# Patient Record
Sex: Male | Born: 1985 | Race: Black or African American | Hispanic: No | Marital: Single | Smoking: Never smoker
Health system: Southern US, Community
[De-identification: ages and names within clinical notes are randomized; demographics above are authoritative.]

## PROBLEM LIST (undated history)

## (undated) DIAGNOSIS — J45909 Unspecified asthma, uncomplicated: Secondary | ICD-10-CM

## (undated) HISTORY — PX: HAND SURGERY: SHX662

---

## 2013-07-03 ENCOUNTER — Emergency Department (HOSPITAL_COMMUNITY)
Admission: EM | Admit: 2013-07-03 | Discharge: 2013-07-03 | Disposition: A | Discharge status: Home / Self Care | Payer: Self-pay | Attending: Emergency Medicine | Admitting: Emergency Medicine

## 2013-07-03 ENCOUNTER — Encounter (HOSPITAL_COMMUNITY): Payer: Self-pay | Admitting: Emergency Medicine

## 2013-07-03 ENCOUNTER — Emergency Department (HOSPITAL_COMMUNITY): Payer: Self-pay

## 2013-07-03 DIAGNOSIS — Y9389 Activity, other specified: Secondary | ICD-10-CM | POA: Insufficient documentation

## 2013-07-03 DIAGNOSIS — Y929 Unspecified place or not applicable: Secondary | ICD-10-CM | POA: Insufficient documentation

## 2013-07-03 DIAGNOSIS — S81809A Unspecified open wound, unspecified lower leg, initial encounter: Principal | ICD-10-CM

## 2013-07-03 DIAGNOSIS — IMO0002 Reserved for concepts with insufficient information to code with codable children: Secondary | ICD-10-CM | POA: Insufficient documentation

## 2013-07-03 DIAGNOSIS — Z79899 Other long term (current) drug therapy: Secondary | ICD-10-CM | POA: Insufficient documentation

## 2013-07-03 DIAGNOSIS — T148XXA Other injury of unspecified body region, initial encounter: Secondary | ICD-10-CM

## 2013-07-03 DIAGNOSIS — S91009A Unspecified open wound, unspecified ankle, initial encounter: Principal | ICD-10-CM

## 2013-07-03 DIAGNOSIS — S8010XA Contusion of unspecified lower leg, initial encounter: Secondary | ICD-10-CM | POA: Insufficient documentation

## 2013-07-03 DIAGNOSIS — S81009A Unspecified open wound, unspecified knee, initial encounter: Secondary | ICD-10-CM | POA: Insufficient documentation

## 2013-07-03 HISTORY — DX: Unspecified asthma, uncomplicated: J45.909

## 2013-07-03 MED ORDER — BACITRACIN ZINC 500 UNIT/GM EX OINT: 1.0000 "application " | TOPICAL_OINTMENT | Freq: Two times a day (BID) | CUTANEOUS | Status: DC

## 2013-07-03 NOTE — ED Notes (Signed)
Pt unable to be aroused at this time, unable to obtain contact info for d/c. RR even and unlabored.

## 2013-07-03 NOTE — ED Notes (Signed)
Bed: [facility]08 Expected date:  Expected time:  Means of arrival:  Comments: 

## 2013-07-03 NOTE — ED Notes (Signed)
pts brother now at bedside, pt awake, wound cleaned and bandaged, pt given scrubs to wear home
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
## 2013-07-23 ENCOUNTER — Encounter (HOSPITAL_COMMUNITY): Payer: Self-pay | Admitting: Emergency Medicine

## 2013-07-23 ENCOUNTER — Emergency Department (HOSPITAL_COMMUNITY)
Admission: EM | Admit: 2013-07-23 | Discharge: 2013-07-23 | Disposition: A | Discharge status: Home / Self Care | Payer: Self-pay | Attending: Emergency Medicine | Admitting: Emergency Medicine

## 2013-07-23 DIAGNOSIS — Z792 Long term (current) use of antibiotics: Secondary | ICD-10-CM | POA: Insufficient documentation

## 2013-07-23 DIAGNOSIS — Z5189 Encounter for other specified aftercare: Secondary | ICD-10-CM

## 2013-07-23 DIAGNOSIS — Z4802 Encounter for removal of sutures: Secondary | ICD-10-CM | POA: Insufficient documentation

## 2013-07-23 DIAGNOSIS — J45909 Unspecified asthma, uncomplicated: Secondary | ICD-10-CM | POA: Insufficient documentation

## 2013-07-23 NOTE — Progress Notes (Signed)
[facility] provided pt with a list of primary care resources to help patient establish a pcp.  °

## 2013-07-23 NOTE — ED Provider Notes (Signed)
CSN: [id number]     Arrival date & time 07/23/13  0845 History   First MD Initiated Contact with Patient 07/23/13 [phone]     Chief Complaint  Patient presents with  . Suture / Staple Removal     (Consider location/radiation/quality/duration/timing/severity/associated sxs/prior Treatment) Patient is a 28 y.o. male presenting with suture removal and wound check. The history is provided by the patient.  Suture / Staple Removal This is a new problem. The current episode started more than 1 week ago. Episode frequency: once. The problem has been resolved. Pertinent negatives include no chest pain, no abdominal pain, no headaches and no shortness of breath. Nothing aggravates the symptoms. Nothing relieves the symptoms. He has tried nothing for the symptoms. The treatment provided significant relief.  Wound Check This is a new problem. The current episode started more than 1 week ago. The problem occurs constantly. The problem has been gradually improving. Pertinent negatives include no chest pain, no abdominal pain, no headaches and no shortness of breath. Nothing aggravates the symptoms. Nothing relieves the symptoms. He has tried nothing for the symptoms. The treatment provided significant relief.    Past Medical History  Diagnosis Date  . Asthma    Past Surgical History  Procedure Laterality Date  . Hand surgery Right    History reviewed. No pertinent family history. History  Substance Use Topics  . Smoking status: Never Smoker   . Smokeless tobacco: Not on file  . Alcohol Use: Yes    Review of Systems  Constitutional: Negative for fever.  HENT: Negative for drooling and rhinorrhea.   Eyes: Negative for pain.  Respiratory: Negative for cough and shortness of breath.   Cardiovascular: Negative for chest pain and leg swelling.  Gastrointestinal: Negative for nausea, vomiting, abdominal pain and diarrhea.  Genitourinary: Negative for dysuria and hematuria.  Musculoskeletal: Negative  for gait problem and neck pain.  Skin: Negative for color change.  Neurological: Negative for numbness and headaches.  Hematological: Negative for adenopathy.  Psychiatric/Behavioral: Negative for behavioral problems.  All other systems reviewed and are negative.     Allergies  Peanut-containing drug products and Eggs or egg-derived products  Home Medications   Prior to Admission medications   Medication Sig Start Date End Date Taking? Authorizing Provider  albuterol (PROVENTIL HFA;VENTOLIN HFA) 108 (90 BASE) MCG/ACT inhaler Inhale 1 puff into the lungs every 6 (six) hours as needed for wheezing or shortness of breath.    Historical Provider, MD  bacitracin ointment Apply 1 application topically 2 (two) times daily. 07/03/13   Brandt LoosenJulie Manly, MD   BP 139/76  Pulse 98  Temp(Src) 97.8 F (36.6 C) (Oral)  Resp 16  Ht 5\' 8"  (1.727 m)  Wt 168 lb (76.204 kg)  BMI 25.55 kg/m2  SpO2 100% Physical Exam  Nursing note and vitals reviewed. Constitutional: He is oriented to person, place, and time. He appears well-developed and well-nourished.  HENT:  Head: Normocephalic and atraumatic.  Right Ear: External ear normal.  Left Ear: External ear normal.  Nose: Nose normal.  Mouth/Throat: Oropharynx is clear and moist. No oropharyngeal exudate.  Eyes: Conjunctivae and EOM are normal. Pupils are equal, round, and reactive to light.  Neck: Normal range of motion. Neck supple.  Cardiovascular: Normal rate, regular rhythm, normal heart sounds and intact distal pulses.  Exam reveals no gallop and no friction rub.   No murmur heard. Pulmonary/Chest: Effort normal and breath sounds normal. No respiratory distress. He has no wheezes.  Abdominal: Soft. Bowel  sounds are normal. He exhibits no distension. There is no tenderness. There is no rebound and no guarding.  Musculoskeletal: Normal range of motion. He exhibits no edema and no tenderness.  Healing laceration to the right proximal anterior shin.  There is some mild superficial purulent drainage around one of the staples. There is no induration or erythema. There is no fluctuance. The patient has normal range of motion of the right knee without pain.  Neurological: He is alert and oriented to person, place, and time.  Skin: Skin is warm and dry.  Psychiatric: He has a normal mood and affect. His behavior is normal.    ED Course  Procedures (including critical care time) Labs Review Labs Reviewed - No data to display  Imaging Review No results found.   EKG Interpretation None     SUTURE REMOVAL Performed by: Purvis Sheffield, S  Consent: Verbal consent obtained. Consent given by: patient Required items: required blood products, implants, devices, and special equipment available Time out: Immediately prior to procedure a "time out" was called to verify the correct patient, procedure, equipment, support staff and site/side marked as required.  Location: right anterior shin  Wound Appearance: clean, as documented in PE  Sutures/Staples Removed: 6  Patient tolerance: Patient tolerated the procedure well with no immediate complications.     MDM   Final diagnoses:  Visit for suture removal  Visit for wound check    9:07 AM 28 y.o. male w presents for staple removal after an injury previously documented here on 6/27. He denies any fevers. He states that he did see some very scant purulent drainage around one of the staples last night. He is afebrile and vital signs are unremarkable here. I removed all the staples. There was some very scant superficial purulent drainage around one of the staples. The wound is not erythematous or indurated. There is no evidence of underlying infection. The wound otherwise appears well. There is no pain with range of motion of the knee. The patient would prefer not to be on antibiotics and I think this is reasonable. Will have him observe the wound for any increased redness, swelling, pain,  or purulence.  9:09 AM:  I have discussed the diagnosis/risks/treatment options with the patient and believe the pt to be eligible for discharge home to follow-up here for any worsening. We also discussed returning to the ED immediately if new or worsening sx occur. We discussed the sx which are most concerning (e.g., redness, swelling, inc pain, purulent drainage) that necessitate immediate return. Medications administered to the patient during their visit and any new prescriptions provided to the patient are listed below.  Medications given during this visit Medications - No data to display  New Prescriptions   No medications on file       Junius Argyle, MD 07/23/13 [phone]
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
## 2013-12-12 ENCOUNTER — Emergency Department (HOSPITAL_COMMUNITY): Payer: Self-pay

## 2013-12-12 ENCOUNTER — Encounter (HOSPITAL_COMMUNITY): Payer: Self-pay | Admitting: Emergency Medicine

## 2013-12-12 ENCOUNTER — Emergency Department (HOSPITAL_COMMUNITY)
Admission: EM | Admit: 2013-12-12 | Discharge: 2013-12-12 | Disposition: A | Discharge status: Home / Self Care | Payer: Self-pay | Attending: Emergency Medicine | Admitting: Emergency Medicine

## 2013-12-12 DIAGNOSIS — R0789 Other chest pain: Secondary | ICD-10-CM | POA: Insufficient documentation

## 2013-12-12 DIAGNOSIS — J45909 Unspecified asthma, uncomplicated: Secondary | ICD-10-CM | POA: Insufficient documentation

## 2013-12-12 DIAGNOSIS — R079 Chest pain, unspecified: Secondary | ICD-10-CM

## 2013-12-12 NOTE — Discharge Instructions (Signed)
°  You may take Ibuprofen 800 mg every 8 hours as needed for pain.  Take with food.  Chest Wall Pain Chest wall pain is pain in or around the bones and muscles of your chest. It may take up to 6 weeks to get better. It may take longer if you must stay physically active in your work and activities.  CAUSES  Chest wall pain may happen on its own. However, it may be caused by:  A viral illness like the flu.  Injury.  Coughing.  Exercise.  Arthritis.  Fibromyalgia.  Shingles. HOME CARE INSTRUCTIONS   Avoid overtiring physical activity. Try not to strain or perform activities that cause pain. This includes any activities using your chest or your abdominal and side muscles, especially if heavy weights are used.  Put ice on the sore area.  Put ice in a plastic bag.  Place a towel between your skin and the bag.  Leave the ice on for 15-20 minutes per hour while awake for the first 2 days.  Only take over-the-counter or prescription medicines for pain, discomfort, or fever as directed by your caregiver. SEEK IMMEDIATE MEDICAL CARE IF:   Your pain increases, or you are very uncomfortable.  You have a fever.  Your chest pain becomes worse.  You have new, unexplained symptoms.  You have nausea or vomiting.  You feel sweaty or lightheaded.  You have a cough with phlegm (sputum), or you cough up blood. MAKE SURE YOU:   Understand these instructions.  Will watch your condition.  Will get help right away if you are not doing well or get worse. Document Released: 12/24/2004 Document Revised: 03/18/2011 Document Reviewed: 08/20/2010 [facility] Patient Information 2015 [employer] [place]. This information is not intended to replace advice given to you by your health care provider. Make sure you discuss any questions you have with your health care provider.
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
## 2013-12-12 NOTE — ED Provider Notes (Signed)
TIME SEEN: 9:40 AM  CHIEF COMPLAINT: Right-sided chest pain  HPI: Patient is a 28 y.o. M with history of asthma who presents emergency department with a week of intermittent right-sided chest pain that feels like a sharp pain that is worse with coughing, movement, palpation. He denies that he has had any shortness of breath, wheezing. No trauma to the chest. No nausea, vomiting, diaphoresis or dizziness. No lower extremity swelling or pain. No history of hypertension, diabetes, hyperlipidemia, tobacco use, family history of premature CAD. No history of recent prolonged immobilization such as hospitalization or long flight, fracture, surgery, trauma. No prior history of PE or DVT.  He states he did drink alcohol last night. Mother reports that he will binge drink on the weekends. Patient denies that he feels this is a problem and does not want detox.  ROS: See HPI Constitutional: no fever  Eyes: no drainage  ENT: no runny nose   Cardiovascular:   chest pain  Resp: no SOB  GI: no vomiting GU: no dysuria Integumentary: no rash  Allergy: no hives  Musculoskeletal: no leg swelling  Neurological: no slurred speech ROS otherwise negative  PAST MEDICAL HISTORY/PAST SURGICAL HISTORY:  Past Medical History  Diagnosis Date  . Asthma     MEDICATIONS:  Prior to Admission medications   Medication Sig Start Date End Date Taking? Authorizing Provider  albuterol (PROVENTIL HFA;VENTOLIN HFA) 108 (90 BASE) MCG/ACT inhaler Inhale 1 puff into the lungs every 6 (six) hours as needed for wheezing or shortness of breath.    Historical Provider, MD  multivitamin (ONE-A-DAY MEN'S) TABS tablet Take 1 tablet by mouth daily.    Historical Provider, MD  naproxen sodium (ANAPROX) 220 MG tablet Take 220 mg by mouth daily as needed (pain).    Historical Provider, MD    ALLERGIES:  Allergies  Allergen Reactions  . Peanut-Containing Drug Products Shortness Of Breath  . Eggs Or Egg-Derived Products     SOCIAL  HISTORY:  History  Substance Use Topics  . Smoking status: Never Smoker   . Smokeless tobacco: Not on file  . Alcohol Use: Yes    FAMILY HISTORY: No family history on file.  EXAM: BP 133/72 mmHg  Pulse 92  Temp(Src) 97.4 F (36.3 C) (Oral)  Resp 20  SpO2 100% CONSTITUTIONAL: Alert and oriented and responds appropriately to questions. Well-appearing; well-nourished, smells of alcohol HEAD: Normocephalic EYES: Conjunctivae clear, PERRL ENT: normal nose; no rhinorrhea; moist mucous membranes; pharynx without lesions noted NECK: Supple, no meningismus, no LAD  CARD: RRR; S1 and S2 appreciated; no murmurs, no clicks, no rubs, no gallops RESP: Normal chest excursion without splinting or tachypnea; breath sounds clear and equal bilaterally; no wheezes, no rhonchi, no rales, no hypoxia or respiratory distress, good aeration diffusely, as being full sentences CHEST:  Tender to palpation over the right chest wall without crepitus or ecchymosis or deformity, no lesions noted over the chest ABD/GI: Normal bowel sounds; non-distended; soft, non-tender, no rebound, no guarding BACK:  The back appears normal and is non-tender to palpation, there is no CVA tenderness EXT: Normal ROM in all joints; non-tender to palpation; no edema; normal capillary refill; no cyanosis    SKIN: Normal color for age and race; warm NEURO: Moves all extremities equally PSYCH: The patient's mood and manner are appropriate. Grooming and personal hygiene are appropriate.  MEDICAL DECISION MAKING: Pt here with right-sided chest wall pain. He is PERC negative and has a HEART score of 0. EKG shows no ischemic  changes. Chest x-ray clear showing no infiltrate, edema or pneumothorax. I feel he is safe to be discharged home. Have advised him to use ibuprofen. Discussed return precautions. Mother at bedside.  They agree with plan.     EKG Interpretation  Date/Time:  Sunday December 12 2013 09:29:02 EST Ventricular Rate:   90 PR Interval:  164 QRS Duration: 100 QT Interval:  350 QTC Calculation: 428 R Axis:   67 Text Interpretation:  Normal sinus rhythm Septal infarct , age undetermined Abnormal ECG No old tracing to compare Confirmed by Alexi Geibel,  DO, Dorlis Judice [phone]([id number]) on 12/12/2013 9:52:14 AM        Cristian MawKristen N Daryl Beehler, DO 12/12/13 1107

## 2016-04-07 IMAGING — DX DG CHEST 2V
2 series · 2 of 2 positions shown · non-contrast
Comparison: No priors.

CLINICAL DATA: 28-year-old male with 1 week history of right-sided
chest pain.

EXAM:
CHEST  2 VIEW

[chest pa]
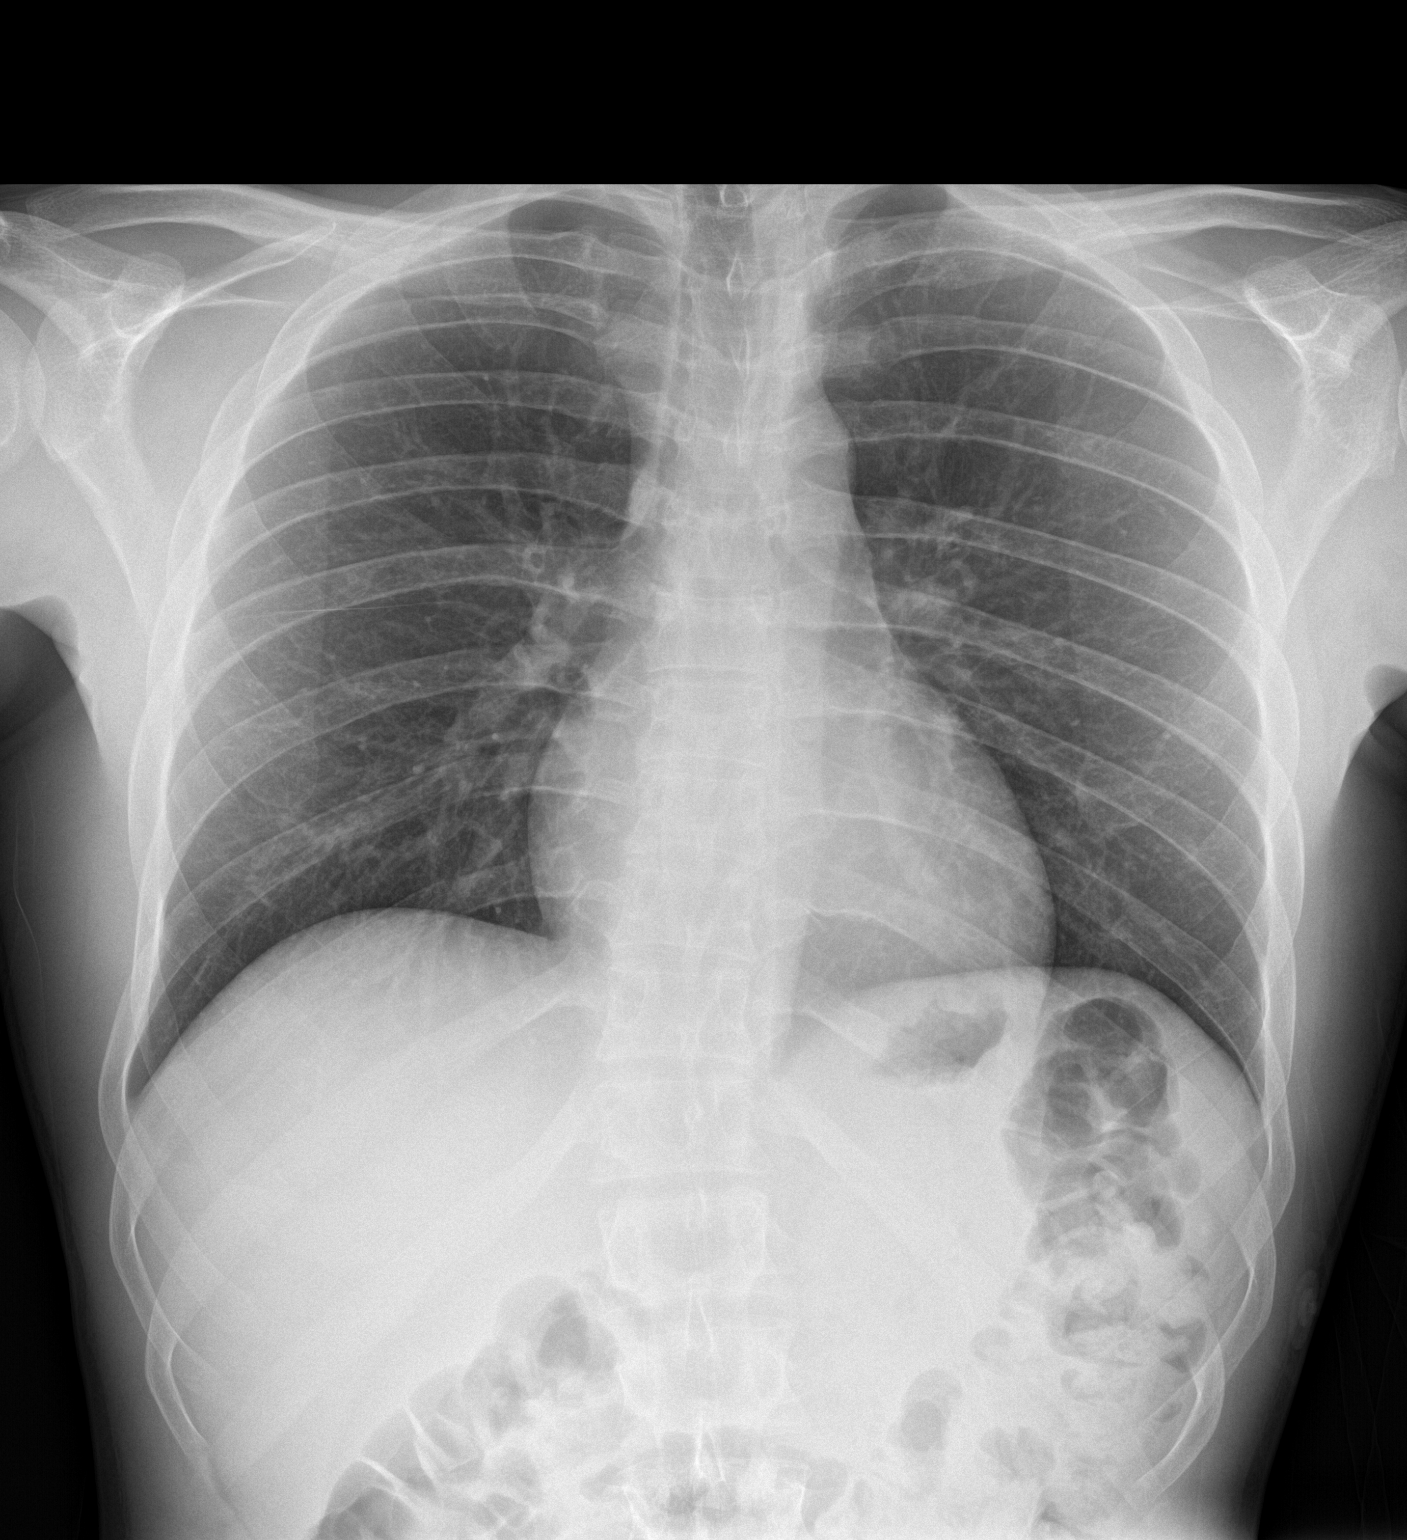

[chest lat]
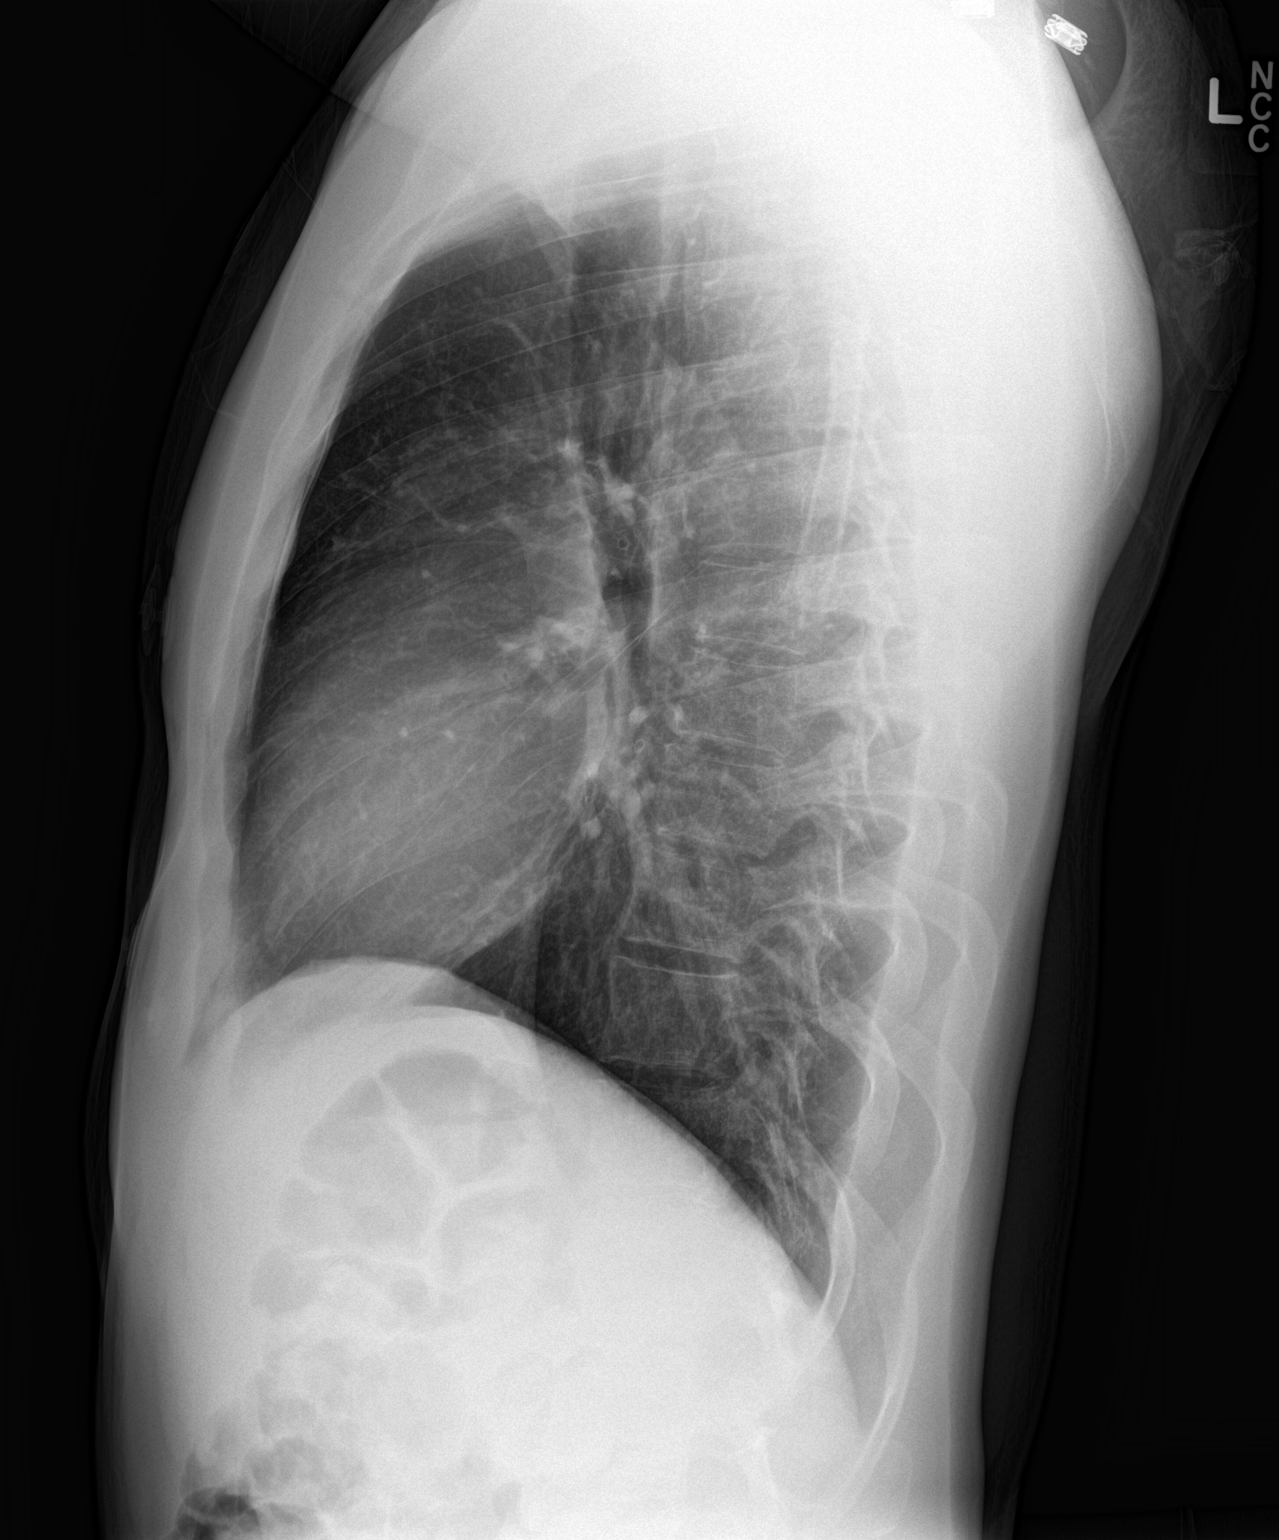

[2 of 2 positions shown; findings below may reference images not displayed]

FINDINGS: Lung volumes are normal. No consolidative airspace disease. No
pleural effusions. No pneumothorax. No pulmonary nodule or mass
noted. Pulmonary vasculature and the cardiomediastinal silhouette
are within normal limits.
IMPRESSION: No radiographic evidence of acute cardiopulmonary disease.

## 2022-12-06 ENCOUNTER — Other Ambulatory Visit (HOSPITAL_BASED_OUTPATIENT_CLINIC_OR_DEPARTMENT_OTHER): Payer: Self-pay

## 2022-12-06 ENCOUNTER — Emergency Department (HOSPITAL_BASED_OUTPATIENT_CLINIC_OR_DEPARTMENT_OTHER)
Admission: EM | Admit: 2022-12-06 | Discharge: 2022-12-06 | Disposition: A | Discharge status: Home / Self Care | Payer: 59 | Attending: Emergency Medicine | Admitting: Emergency Medicine

## 2022-12-06 ENCOUNTER — Other Ambulatory Visit: Payer: Self-pay

## 2022-12-06 ENCOUNTER — Encounter (HOSPITAL_BASED_OUTPATIENT_CLINIC_OR_DEPARTMENT_OTHER): Payer: Self-pay | Admitting: Emergency Medicine

## 2022-12-06 DIAGNOSIS — Z760 Encounter for issue of repeat prescription: Secondary | ICD-10-CM | POA: Diagnosis present

## 2022-12-06 DIAGNOSIS — Z7951 Long term (current) use of inhaled steroids: Secondary | ICD-10-CM | POA: Insufficient documentation

## 2022-12-06 DIAGNOSIS — Z91010 Allergy to peanuts: Secondary | ICD-10-CM | POA: Diagnosis not present

## 2022-12-06 DIAGNOSIS — J45909 Unspecified asthma, uncomplicated: Secondary | ICD-10-CM | POA: Insufficient documentation

## 2022-12-06 DIAGNOSIS — I10 Essential (primary) hypertension: Secondary | ICD-10-CM | POA: Insufficient documentation

## 2022-12-06 MED ORDER — ALBUTEROL SULFATE HFA 108 (90 BASE) MCG/ACT IN AERS
1.0000 | INHALATION_SPRAY | Freq: Four times a day (QID) | RESPIRATORY_TRACT | 2 refills | Status: DC | PRN
  Filled 2022-12-06: qty 6.7, 25d supply, fill #0

## 2022-12-06 MED ORDER — ALBUTEROL SULFATE HFA 108 (90 BASE) MCG/ACT IN AERS
1.0000 | INHALATION_SPRAY | Freq: Once | RESPIRATORY_TRACT | Status: AC
  Administered 2022-12-06: 1 via RESPIRATORY_TRACT
  Filled 2022-12-06: qty 6.7

## 2022-12-06 NOTE — ED Triage Notes (Signed)
Needs a refill on inhaler

## 2022-12-06 NOTE — Discharge Instructions (Signed)
As discussed, albuterol inhalers have been sent to your pharmacy.  Please do not hesitate to return if the worrisome signs and symptoms we discussed become apparent.

## 2022-12-06 NOTE — ED Provider Notes (Signed)
[place] EMERGENCY DEPARTMENT AT [facility] Provider Note   CSN: [id number] Arrival date & time: 12/06/22  1135     History  Chief Complaint  Patient presents with   Medication Refill    Cristian Crawford is a 37 y.o. male.   Medication Refill   37 year old male presents emergency department with request for medication refill.  Patient with history of asthma and is on albuterol inhaler.  States that he just realized the past few days that he was out of his albuterol inhaler.  This feels slightly wheezy but states "it is not really that bad."  Does not have a primary care so he came to the emergency department.  Denies any fever, symptoms of shortness of breath, chest pain, cough, abdominal pain, nausea, vomiting.  Past medical history significant for asthma  Home Medications Prior to Admission medications   Medication Sig Start Date End Date Taking? Authorizing Provider  albuterol (VENTOLIN HFA) 108 (90 Base) MCG/ACT inhaler Inhale 1-2 puffs into the lungs every 6 (six) hours as needed for wheezing or shortness of breath. 12/06/22  Yes Sherian Maroon A, PA  multivitamin (ONE-A-DAY MEN'S) TABS tablet Take 1 tablet by mouth daily.    [provider]  naproxen sodium (ANAPROX) 220 MG tablet Take 220 mg by mouth daily as needed (pain).    [provider]      Allergies    Peanut-containing drug products and Egg-derived products    Review of Systems   Review of Systems  All other systems reviewed and are negative.   Physical Exam Updated Vital Signs BP (!) 145/92   Pulse 78   Temp 98.3 F (36.8 C) (Oral)   Resp 18   Ht 5\' 8"  (1.727 m)   SpO2 100%   BMI 25.54 kg/m  Physical Exam Vitals and nursing note reviewed.  Constitutional:      General: He is not in acute distress.    Appearance: He is well-developed.  HENT:     Head: Normocephalic and atraumatic.  Eyes:     Conjunctiva/sclera: Conjunctivae normal.  Cardiovascular:     Rate and  Rhythm: Normal rate and regular rhythm.     Heart sounds: No murmur heard. Pulmonary:     Effort: Pulmonary effort is normal. No respiratory distress.     Breath sounds: Wheezing present.     Comments: Faint wheeze auscultated bilateral lung fields. Abdominal:     Palpations: Abdomen is soft.     Tenderness: There is no abdominal tenderness.  Musculoskeletal:        General: No swelling.     Cervical back: Neck supple.     Right lower leg: No edema.     Left lower leg: No edema.  Skin:    General: Skin is warm and dry.     Capillary Refill: Capillary refill takes less than 2 seconds.  Neurological:     Mental Status: He is alert.  Psychiatric:        Mood and Affect: Mood normal.     ED Results / Procedures / Treatments   Labs (all labs ordered are listed, but only abnormal results are displayed) Labs Reviewed - No data to display  EKG None  Radiology No results found.  Procedures Procedures    Medications Ordered in ED Medications  albuterol (VENTOLIN HFA) 108 (90 Base) MCG/ACT inhaler 1 puff (1 puff Inhalation Given 12/06/22 1159)    ED Course/ Medical Decision Making/ A&P  Medical Decision Making Risk Prescription drug management.   This patient presents to the ED for concern of medication refill, this involves an extensive number of treatment options, and is a complaint that carries with it a high risk of complications and morbidity.  The differential diagnosis includes medication refill, asthma   Co morbidities that complicate the patient evaluation  See HPI   Additional history obtained:  Additional history obtained from EMR External records from outside source obtained and reviewed including hospital records   Lab Tests:  N/a   Imaging Studies ordered:  N/a   Cardiac Monitoring: / EKG:  The patient was maintained on a cardiac monitor.  I personally viewed and interpreted the cardiac monitored which  showed an underlying rhythm of: sinus rhythm   Consultations Obtained:   N/a   Problem List / ED Course / Critical interventions / Medication management  Medication refill I ordered medication including albuterol   Reevaluation of the patient after these medicines showed that the patient improved I have reviewed the patients home medicines and have made adjustments as needed   Social Determinants of Health:  Denies tobacco, illicit drug use.  Alcohol abuse.   Test / Admission - Considered:  Medication refill  Vitals signs significant for hypertension blood pressure 145/92. Otherwise within normal range and stable throughout visit. 37 year old male presents emergency department with request of medication refill.  Has been on albuterol secondary to asthma.  Has been without his albuterol for the past 3 days.  States that he feels "slightly wheezy but denies any shortness of breath or chest pain.  On exam, patient with faint wheeze auscultated bilateral lung fields.  Patient states that he just wants albuterol and not to be treated for mild asthma exacerbation.  Will refill patient's albuterol.  Will recommend establishing care with primary care provider for ongoing assessment/treatment of patient's asthma.  Treatment plan discussed at length with patient and he acknowledged understanding was agreeable to said plan.  Further workup deemed unnecessary at this time on the ED.  Patient overall well-appearing, afebrile in no acute distress. Worrisome signs and symptoms were discussed with the patient, and the patient acknowledged understanding to return to the ED if noticed. Patient was stable upon discharge.          Final Clinical Impression(s) / ED Diagnoses Final diagnoses:  Encounter for medication refill    Rx / DC Orders ED Discharge Orders          Ordered    albuterol (VENTOLIN HFA) 108 (90 Base) MCG/ACT inhaler  Every 6 hours PRN        12/06/22 1151               Peter Garter, [place] 12/06/22 1206    Gwyneth Sprout, MD 12/06/22 1530

## 2022-12-06 NOTE — ED Notes (Signed)

## 2022-12-16 ENCOUNTER — Other Ambulatory Visit (HOSPITAL_BASED_OUTPATIENT_CLINIC_OR_DEPARTMENT_OTHER): Payer: Self-pay

## 2023-04-18 ENCOUNTER — Emergency Department (HOSPITAL_BASED_OUTPATIENT_CLINIC_OR_DEPARTMENT_OTHER)
Admission: EM | Admit: 2023-04-18 | Discharge: 2023-04-18 | Disposition: A | Discharge status: Home / Self Care | Attending: Emergency Medicine | Admitting: Emergency Medicine

## 2023-04-18 ENCOUNTER — Encounter (HOSPITAL_BASED_OUTPATIENT_CLINIC_OR_DEPARTMENT_OTHER): Payer: Self-pay | Admitting: Emergency Medicine

## 2023-04-18 ENCOUNTER — Other Ambulatory Visit: Payer: Self-pay

## 2023-04-18 DIAGNOSIS — R0602 Shortness of breath: Secondary | ICD-10-CM | POA: Diagnosis present

## 2023-04-18 DIAGNOSIS — Z91010 Allergy to peanuts: Secondary | ICD-10-CM | POA: Insufficient documentation

## 2023-04-18 DIAGNOSIS — J45901 Unspecified asthma with (acute) exacerbation: Secondary | ICD-10-CM | POA: Diagnosis not present

## 2023-04-18 DIAGNOSIS — Z7951 Long term (current) use of inhaled steroids: Secondary | ICD-10-CM | POA: Insufficient documentation

## 2023-04-18 MED ORDER — ALBUTEROL SULFATE HFA 108 (90 BASE) MCG/ACT IN AERS
2.0000 | INHALATION_SPRAY | Freq: Four times a day (QID) | RESPIRATORY_TRACT | Status: DC | PRN
  Administered 2023-04-18: 2 via RESPIRATORY_TRACT
  Filled 2023-04-18: qty 6.7

## 2023-04-18 MED ORDER — ALBUTEROL SULFATE (2.5 MG/3ML) 0.083% IN NEBU
2.5000 mg | INHALATION_SOLUTION | Freq: Once | RESPIRATORY_TRACT | Status: AC
  Administered 2023-04-18: 2.5 mg via RESPIRATORY_TRACT
  Filled 2023-04-18: qty 3

## 2023-04-18 MED ORDER — IPRATROPIUM-ALBUTEROL 0.5-2.5 (3) MG/3ML IN SOLN
3.0000 mL | Freq: Once | RESPIRATORY_TRACT | Status: AC
  Administered 2023-04-18: 3 mL via RESPIRATORY_TRACT
  Filled 2023-04-18: qty 3

## 2023-04-18 MED ORDER — PREDNISONE 20 MG PO TABS: 40.0000 mg | ORAL_TABLET | Freq: Every day | ORAL | 0 refills | Status: DC

## 2023-04-18 NOTE — ED Provider Notes (Signed)
 [place] EMERGENCY DEPARTMENT AT [facility] Provider Note   CSN: [id number] Arrival date & time: 04/18/23  0859     History  Chief Complaint  Patient presents with   Shortness of Breath    Cristian Crawford is a 38 y.o. male.  Patient presents to the emergency department for evaluation of asthma exacerbation.  Patient has had asthma since childhood.  He states that typically he will get flares in the spring time when the allergies are worse.  Also reports history of eczema.  He has had symptoms over the past 2 to 3 days, but was much worse this morning.  He estimates taking his inhaler about 12 times since 2 AM.  It was not helping.  He was having trouble at the start of work today, prompting emergency department visit.  Typically he states that he gets a lot better after prednisone.  Denies associated ear pain, sore throat.  He has had a runny nose, he relates to allergies.  No chest pain.       Home Medications Prior to Admission medications   Medication Sig Start Date End Date Taking? Authorizing Provider  predniSONE (DELTASONE) 20 MG tablet Take 2 tablets (40 mg total) by mouth daily. 04/18/23  Yes Bodi Palmeri, PA-C  albuterol (VENTOLIN HFA) 108 (90 Base) MCG/ACT inhaler Inhale 1-2 puffs into the lungs every 6 (six) hours as needed for wheezing or shortness of breath. 12/06/22   Dearing Butter, PA  multivitamin (ONE-A-DAY MEN'S) TABS tablet Take 1 tablet by mouth daily.    [provider]  naproxen sodium (ANAPROX) 220 MG tablet Take 220 mg by mouth daily as needed (pain).    [provider]      Allergies    Peanut-containing drug products and Egg-derived products    Review of Systems   Review of Systems  Physical Exam Updated Vital Signs BP (!) 143/90 (BP Location: Right Arm)   Pulse 86   Temp 98.9 F (37.2 C) (Oral)   Resp 18   SpO2 96%  Physical Exam Vitals and nursing note reviewed.  Constitutional:      General: He is not in  acute distress.    Appearance: He is well-developed.  HENT:     Head: Normocephalic and atraumatic.  Eyes:     General:        Right eye: No discharge.        Left eye: No discharge.     Conjunctiva/sclera: Conjunctivae normal.  Cardiovascular:     Rate and Rhythm: Normal rate and regular rhythm.     Heart sounds: Normal heart sounds.  Pulmonary:     Effort: Pulmonary effort is normal.     Breath sounds: Normal breath sounds. No decreased breath sounds, wheezing, rhonchi or rales.     Comments: Lung sounds are clear, patient already on albuterol neb at time of exam Abdominal:     Palpations: Abdomen is soft.     Tenderness: There is no abdominal tenderness.  Musculoskeletal:     Cervical back: Normal range of motion and neck supple.     Right lower leg: No edema.     Left lower leg: No edema.  Skin:    General: Skin is warm and dry.  Neurological:     Mental Status: He is alert.     ED Results / Procedures / Treatments   Labs (all labs ordered are listed, but only abnormal results are displayed) Labs Reviewed - No data to  display  EKG None  Radiology No results found.  Procedures Procedures    Medications Ordered in ED Medications  ipratropium-albuterol (DUONEB) 0.5-2.5 (3) MG/3ML nebulizer solution 3 mL (3 mLs Nebulization Given 04/18/23 0917)  albuterol (PROVENTIL) (2.5 MG/3ML) 0.083% nebulizer solution 2.5 mg (2.5 mg Nebulization Given 04/18/23 5409)    ED Course/ Medical Decision Making/ A&P    Patient seen and examined. History obtained directly from patient.   Labs/EKG: None ordered  Imaging: None ordered  Medications/Fluids: Ordered: Albuterol nebulizer, albuterol inhaler.  Patient will be given a dose of p.o. prednisone.  Most recent vital signs reviewed and are as follows: BP (!) 143/90 (BP Location: Right Arm)   Pulse 86   Temp 98.9 F (37.2 C) (Oral)   Resp 18   SpO2 96%   Initial impression: Asthma exacerbation, improving.  10:18 AM  Reassessment performed. Patient appears stable.  He states that he is feeling a lot better.  Lungs remain clear to auscultation bilaterally.  Most current vital signs reviewed and are as follows: BP (!) 143/90 (BP Location: Right Arm)   Pulse 86   Temp 98.9 F (37.2 C) (Oral)   Resp 18   SpO2 96%   Plan: Discharged to home  Prescriptions written for: Prednisone  Other home care instructions discussed: Albuterol inhaler every 4 hours for the next 24 hours  ED return instructions discussed: Return with worsening shortness of breath, trouble breathing, fever, increased work of breathing or other concerns  Follow-up instructions discussed: Patient encouraged to follow-up with their PCP as needed for long-term management of asthma.                                Medical Decision Making Risk Prescription drug management.   Patient presents with wheezing, shortness of breath consistent with asthma exacerbation.  He responded well to albuterol nebulizer in the ED.  He will be started on prednisone burst.  Symptoms likely exacerbated by seasonal allergies.  Patient looks very well at time of discharge.  No concern for pneumonia.         Final Clinical Impression(s) / ED Diagnoses Final diagnoses:  Exacerbation of asthma, unspecified asthma severity, unspecified whether persistent    Rx / DC Orders ED Discharge Orders          Ordered    predniSONE (DELTASONE) 20 MG tablet  Daily        04/18/23 0938              Lyna Sandhoff, PA-C 04/18/23 1022    Hershel Los, MD 04/20/23 [phone]

## 2023-04-18 NOTE — ED Notes (Signed)
 Discharge paperwork given and verbally understood.

## 2023-04-18 NOTE — Discharge Instructions (Signed)
 Please read and follow all provided instructions.  Your diagnoses today include:  1. Exacerbation of asthma, unspecified asthma severity, unspecified whether persistent    Tests performed today include: Vital signs. See below for your results today.   Medications prescribed:  Prednisone - steroid medicine   It is best to take this medication in the morning to prevent sleeping problems. If you are diabetic, monitor your blood sugar closely and stop taking Prednisone if blood sugar is over 300. Take with food to prevent stomach upset.   Take any prescribed medications only as directed.  Home care instructions:  Follow any educational materials contained in this packet.  Follow-up instructions: Please follow-up with your primary care provider as needed for further evaluation of your symptoms and management of your asthma.  Return instructions:  Please return to the Emergency Department if you experience worsening symptoms. Please return with worsening wheezing, shortness of breath, or difficulty breathing. Return with persistent fever above 101F.  Please return if you have any other emergent concerns.  Additional Information:  Your vital signs today were: BP (!) 143/90 (BP Location: Right Arm)   Pulse 86   Temp 98.9 F (37.2 C) (Oral)   Resp 18   SpO2 96%  If your blood pressure (BP) was elevated above 135/85 this visit, please have this repeated by your doctor within one month. --------------

## 2023-04-18 NOTE — ED Triage Notes (Signed)
 States has had asthma flare up for 2 days now. Has used his inhaler multiple times with no relief. Denies CP.

## 2023-11-22 ENCOUNTER — Emergency Department (HOSPITAL_BASED_OUTPATIENT_CLINIC_OR_DEPARTMENT_OTHER)

## 2023-11-22 ENCOUNTER — Encounter (HOSPITAL_BASED_OUTPATIENT_CLINIC_OR_DEPARTMENT_OTHER): Payer: Self-pay

## 2023-11-22 ENCOUNTER — Emergency Department (HOSPITAL_BASED_OUTPATIENT_CLINIC_OR_DEPARTMENT_OTHER)
Admission: EM | Admit: 2023-11-22 | Discharge: 2023-11-22 | Disposition: A | Discharge status: Home / Self Care | Attending: Emergency Medicine | Admitting: Emergency Medicine

## 2023-11-22 ENCOUNTER — Other Ambulatory Visit: Payer: Self-pay

## 2023-11-22 DIAGNOSIS — R051 Acute cough: Secondary | ICD-10-CM | POA: Insufficient documentation

## 2023-11-22 DIAGNOSIS — Z91010 Allergy to peanuts: Secondary | ICD-10-CM | POA: Diagnosis not present

## 2023-11-22 DIAGNOSIS — M546 Pain in thoracic spine: Secondary | ICD-10-CM | POA: Insufficient documentation

## 2023-11-22 DIAGNOSIS — M7918 Myalgia, other site: Secondary | ICD-10-CM

## 2023-11-22 DIAGNOSIS — J45909 Unspecified asthma, uncomplicated: Secondary | ICD-10-CM | POA: Diagnosis not present

## 2023-11-22 DIAGNOSIS — M549 Dorsalgia, unspecified: Secondary | ICD-10-CM | POA: Diagnosis present

## 2023-11-22 MED ORDER — CYCLOBENZAPRINE HCL 5 MG PO TABS
5.0000 mg | ORAL_TABLET | Freq: Once | ORAL | Status: AC
  Administered 2023-11-22: 5 mg via ORAL
  Filled 2023-11-22: qty 1

## 2023-11-22 MED ORDER — KETOROLAC TROMETHAMINE 30 MG/ML IJ SOLN
30.0000 mg | Freq: Once | INTRAMUSCULAR | Status: AC
  Administered 2023-11-22: 30 mg via INTRAMUSCULAR
  Filled 2023-11-22: qty 1

## 2023-11-22 MED ORDER — ALBUTEROL SULFATE HFA 108 (90 BASE) MCG/ACT IN AERS
2.0000 | INHALATION_SPRAY | Freq: Once | RESPIRATORY_TRACT | Status: AC
  Administered 2023-11-22: 2 via RESPIRATORY_TRACT
  Filled 2023-11-22: qty 6.7

## 2023-11-22 MED ORDER — IBUPROFEN 600 MG PO TABS: 600.0000 mg | ORAL_TABLET | Freq: Four times a day (QID) | ORAL | 0 refills | Status: DC | PRN

## 2023-11-22 NOTE — Discharge Instructions (Signed)
 You were seen today for cough and back pain.  You are not wheezing on exam.  However given your history of asthma, use inhaler as needed.  Regarding your pain, your chest x-ray is reassuring.  Take ibuprofen for anti-inflammatory effect.

## 2023-11-22 NOTE — ED Provider Notes (Signed)
 [place] EMERGENCY DEPARTMENT AT [facility] Provider Note   CSN: [id number] Arrival date & time: 11/22/23  0455     Patient presents with: Asthma   Cristian Crawford is a 38 y.o. male.   HPI     This is a 38 year old male with a history of asthma who presents with back pain.  Patient states that recently he has developed a cough.  He wonders if it may be related to his asthma.  Cough is nonproductive.  He has not had any fevers.  However, he states that now every time he coughs he has pain in the right side of his back.  He does report heavy lifting at work and thinks he may have pulled a muscle.  No fevers.  He ran out of his inhaler on Monday.  Prior to Admission medications   Medication Sig Start Date End Date Taking? Authorizing Provider  ibuprofen (ADVIL) 600 MG tablet Take 1 tablet (600 mg total) by mouth every 6 (six) hours as needed. 11/22/23  Yes Joshawn Crissman, Charmaine FALCON, MD  albuterol  (VENTOLIN  HFA) 108 (90 Base) MCG/ACT inhaler Inhale 1-2 puffs into the lungs every 6 (six) hours as needed for wheezing or shortness of breath. 12/06/22   Silver Wonda LABOR, PA  multivitamin (ONE-A-DAY MEN'S) TABS tablet Take 1 tablet by mouth daily.    [provider]  naproxen sodium (ANAPROX) 220 MG tablet Take 220 mg by mouth daily as needed (pain).    [provider]  predniSONE  (DELTASONE ) 20 MG tablet Take 2 tablets (40 mg total) by mouth daily. 04/18/23   Geiple, Joshua, PA-C    Allergies: Peanut-containing drug products and Egg protein-containing drug products    Review of Systems  Constitutional:  Negative for fever.  Respiratory:  Positive for cough. Negative for shortness of breath and wheezing.   Cardiovascular:  Negative for chest pain.  Musculoskeletal:  Positive for back pain.  All other systems reviewed and are negative.   Updated Vital Signs BP (!) 165/98   Pulse 94   Temp 97.8 F (36.6 C)   Resp 16   SpO2 98%   Physical Exam Vitals and  nursing note reviewed.  Constitutional:      Appearance: He is well-developed. He is not ill-appearing.  HENT:     Head: Normocephalic and atraumatic.  Eyes:     Pupils: Pupils are equal, round, and reactive to light.  Cardiovascular:     Rate and Rhythm: Normal rate and regular rhythm.     Heart sounds: Normal heart sounds. No murmur heard. Pulmonary:     Effort: Pulmonary effort is normal. No respiratory distress.     Breath sounds: Normal breath sounds. No wheezing.  Abdominal:     Palpations: Abdomen is soft.     Tenderness: There is no abdominal tenderness. There is no rebound.  Musculoskeletal:     Cervical back: Neck supple.     Comments: Tenderness to palpation right paraspinous muscle region of the mid thoracic spine, no midline tenderness to palpation, step-off, deformity  Lymphadenopathy:     Cervical: No cervical adenopathy.  Skin:    General: Skin is warm and dry.  Neurological:     Mental Status: He is alert and oriented to person, place, and time.  Psychiatric:        Mood and Affect: Mood normal.     (all labs ordered are listed, but only abnormal results are displayed) Labs Reviewed - No data to display  EKG: EKG  Interpretation Date/Time:  Saturday November 22 2023 05:03:01 EST Ventricular Rate:  88 PR Interval:  166 QRS Duration:  93 QT Interval:  349 QTC Calculation: 423 R Axis:   89  Text Interpretation: Sinus rhythm Right atrial enlargement Probable LVH with secondary repol abnrm No significant change since last tracing Confirmed by Bari Pfeiffer ([id number]) on 11/22/2023 5:45:58 AM  Radiology: [facility] Chest Portable 1 View Result Date: 11/22/2023 EXAM: 1 VIEW(S) XRAY OF THE CHEST 11/22/2023 05:28:00 AM COMPARISON: PA and lateral radiographs of the chest dated 12/12/2013. CLINICAL HISTORY: posterior CP FINDINGS: LUNGS AND PLEURA: No focal pulmonary opacity. No pleural effusion. No pneumothorax. HEART AND MEDIASTINUM: No acute abnormality of the cardiac  and mediastinal silhouettes. BONES AND SOFT TISSUES: No acute osseous abnormality. IMPRESSION: 1. No acute process. Electronically signed by: Evalene Coho MD 11/22/2023 05:43 AM EST RP Workstation: [id number]     Procedures   Medications Ordered in the ED  albuterol  (VENTOLIN  HFA) 108 (90 Base) MCG/ACT inhaler 2 puff (has no administration in time range)  ketorolac (TORADOL) 30 MG/ML injection 30 mg (30 mg Intramuscular Given 11/22/23 0524)  cyclobenzaprine (FLEXERIL) tablet 5 mg (5 mg Oral Given 11/22/23 0524)                                    Medical Decision Making Amount and/or Complexity of Data Reviewed Radiology: ordered.  Risk Prescription drug management.   This patient presents to the ED for concern of cough, back pain, this involves an extensive number of treatment options, and is a complaint that carries with it a high risk of complications and morbidity.  I considered the following differential and admission for this acute, potentially life threatening condition.  The differential diagnosis includes bronchitis, pneumonia, asthma aspiration, upper respiratory infection, musculoskeletal pain less likely PE  MDM:    This is a 38 year old male who presents with back pain.  Worse with cough.  Recent cough which he attributes to asthma.  However, on my exam he is not actively wheezing.  He is otherwise hemodynamically stable and vital signs are reassuring with the exception of a blood pressure of 165/98.  EKG shows no evidence of acute ischemia or arrhythmia.  Chest x-ray is without pneumonia.  No rib changes or pneumothorax.  Given reproducible pain on exam, suspect musculoskeletal etiology.  Patient given Toradol and Flexeril.  He was given an inhaler to refill regarding his asthma although he does not appear to be in acute exacerbation.  Recommend anti-inflammatories at home.  (Labs, imaging, consults)  Labs: I Ordered, and personally interpreted labs.  The pertinent  results include: None  Imaging Studies ordered: I ordered imaging studies including x-ray I independently visualized and interpreted imaging. I agree with the radiologist interpretation  Additional history obtained from chart review.  External records from outside source obtained and reviewed including prior evaluations  Cardiac Monitoring: The patient was maintained on a cardiac monitor.  If on the cardiac monitor, I personally viewed and interpreted the cardiac monitored which showed an underlying rhythm of: Sinus  Reevaluation: After the interventions noted above, I reevaluated the patient and found that they have :improved  Social Determinants of Health:  lives independently  Disposition: Discharge  Co morbidities that complicate the patient evaluation  Past Medical History:  Diagnosis Date   Asthma      Medicines Meds ordered this encounter  Medications   ketorolac (TORADOL) 30 MG/ML injection  30 mg   cyclobenzaprine (FLEXERIL) tablet 5 mg   albuterol  (VENTOLIN  HFA) 108 (90 Base) MCG/ACT inhaler 2 puff   ibuprofen (ADVIL) 600 MG tablet    Sig: Take 1 tablet (600 mg total) by mouth every 6 (six) hours as needed.    Dispense:  30 tablet    Refill:  0    I have reviewed the patients home medicines and have made adjustments as needed  Problem List / ED Course: Problem List Items Addressed This Visit   None Visit Diagnoses       Musculoskeletal pain    -  Primary     Acute cough                    Final diagnoses:  Musculoskeletal pain  Acute cough    ED Discharge Orders          Ordered    ibuprofen (ADVIL) 600 MG tablet  Every 6 hours PRN        11/22/23 0546               Bari Charmaine FALCON, MD 11/22/23 [phone]

## 2023-11-22 NOTE — ED Triage Notes (Signed)
 Pt c/o asthma flare up, I was at work the other day & think I pulled something in my back; every time I cough my R side/ back hurts. States he's been out of asthma meds since Monday.

## 2023-11-22 NOTE — ED Notes (Signed)
 Pt seen in room 13 by this RT. Clear BLB , no distress noted.

## 2023-12-01 ENCOUNTER — Encounter (HOSPITAL_COMMUNITY): Payer: Self-pay

## 2023-12-01 ENCOUNTER — Other Ambulatory Visit: Payer: Self-pay

## 2023-12-01 ENCOUNTER — Emergency Department (HOSPITAL_COMMUNITY)
Admission: EM | Admit: 2023-12-01 | Discharge: 2023-12-01 | Attending: Emergency Medicine | Admitting: Emergency Medicine

## 2023-12-01 DIAGNOSIS — Y909 Presence of alcohol in blood, level not specified: Secondary | ICD-10-CM | POA: Diagnosis not present

## 2023-12-01 DIAGNOSIS — F10920 Alcohol use, unspecified with intoxication, uncomplicated: Secondary | ICD-10-CM

## 2023-12-01 DIAGNOSIS — F10129 Alcohol abuse with intoxication, unspecified: Secondary | ICD-10-CM | POA: Insufficient documentation

## 2023-12-01 NOTE — ED Provider Notes (Signed)
 [place] EMERGENCY DEPARTMENT AT [facility] Provider Note   CSN: [id number] Arrival date & time: 12/01/23  9991     Patient presents with: Alcohol Intoxication   Cristian Crawford is a 38 y.o. male.   38 yo M with a chief complaints of alcohol intoxication.  Picked up by police.  Reportedly police no longer allowed to watch intoxicated individuals.  Was taken here for evaluation.  Patient with limited history secondary to intoxication.  Sounds like he called his mom and got to a verbal altercation with her.  He says he would rather be somewhere just chilling.   Alcohol Intoxication       Prior to Admission medications   Medication Sig Start Date End Date Taking? Authorizing Provider  albuterol  (VENTOLIN  HFA) 108 (90 Base) MCG/ACT inhaler Inhale 1-2 puffs into the lungs every 6 (six) hours as needed for wheezing or shortness of breath. 12/06/22   Silver Wonda LABOR, PA  ibuprofen  (ADVIL ) 600 MG tablet Take 1 tablet (600 mg total) by mouth every 6 (six) hours as needed. 11/22/23   Horton, Charmaine FALCON, MD  multivitamin (ONE-A-DAY MEN'S) TABS tablet Take 1 tablet by mouth daily.    [provider]  naproxen sodium (ANAPROX) 220 MG tablet Take 220 mg by mouth daily as needed (pain).    [provider]  predniSONE  (DELTASONE ) 20 MG tablet Take 2 tablets (40 mg total) by mouth daily. 04/18/23   Geiple, Joshua, PA-C    Allergies: Peanut-containing drug products and Egg protein-containing drug products    Review of Systems  Updated Vital Signs BP (!) 154/99   Pulse 91   Temp (!) 97.4 F (36.3 C) (Oral)   Resp 18   SpO2 97%   Physical Exam Vitals and nursing note reviewed.  Constitutional:      Appearance: He is well-developed.     Comments: Slurred speech  HENT:     Head: Normocephalic and atraumatic.  Eyes:     Pupils: Pupils are equal, round, and reactive to light.  Neck:     Vascular: No JVD.  Cardiovascular:     Rate and Rhythm: Normal  rate and regular rhythm.     Heart sounds: No murmur heard.    No friction rub. No gallop.  Pulmonary:     Effort: No respiratory distress.     Breath sounds: No wheezing.  Abdominal:     General: There is no distension.     Tenderness: There is no abdominal tenderness. There is no guarding or rebound.  Musculoskeletal:        General: Normal range of motion.     Cervical back: Normal range of motion and neck supple.  Skin:    Coloration: Skin is not pale.     Findings: No rash.  Neurological:     Mental Status: He is alert and oriented to person, place, and time.  Psychiatric:        Behavior: Behavior normal.     (all labs ordered are listed, but only abnormal results are displayed) Labs Reviewed - No data to display  EKG: None  Radiology: No results found.   Procedures   Medications Ordered in the ED - No data to display                                  Medical Decision Making  38 yo M with a chief complaints of  alcohol intoxication.  Patient brought in by police.  Patient has no complaints.  It sounds like no one was available to take him home.  Awake and alert, able to ambulate.  Police willing to take him to jail.   12:36 AM:  I have discussed the diagnosis/risks/treatment options with the patient.  Evaluation and diagnostic testing in the emergency department does not suggest an emergent condition requiring admission or immediate intervention beyond what has been performed at this time.  They will follow up with PCP. We also discussed returning to the ED immediately if new or worsening sx occur. We discussed the sx which are most concerning (e.g., sudden worsening pain, fever, inability to tolerate by mouth) that necessitate immediate return. Medications administered to the patient during their visit and any new prescriptions provided to the patient are listed below.  Medications given during this visit Medications - No data to display   The patient appears  reasonably screen and/or stabilized for discharge and I doubt any other medical condition or other [facility] requiring further screening, evaluation, or treatment in the ED at this time prior to discharge.       Final diagnoses:  Alcoholic intoxication without complication    ED Discharge Orders     None          Emil Share, DO 12/01/23 0036

## 2023-12-01 NOTE — Discharge Instructions (Signed)
Follow up with your family doc in the office.  

## 2023-12-01 NOTE — ED Triage Notes (Signed)
 Pt BIB police, etoh. Pt ambulatory, alert.

## 2023-12-01 NOTE — ED Notes (Signed)
 Pt refused Temp, Stated he did not want to be touched.

## 2023-12-01 NOTE — ED Notes (Signed)
 Security was called due to pt being discharged and then stated he did not want to get up.

## 2023-12-18 ENCOUNTER — Emergency Department (HOSPITAL_COMMUNITY)
Admission: EM | Admit: 2023-12-18 | Discharge: 2023-12-18 | Disposition: A | Discharge status: Home / Self Care | Attending: Emergency Medicine | Admitting: Emergency Medicine

## 2023-12-18 ENCOUNTER — Other Ambulatory Visit: Payer: Self-pay

## 2023-12-18 ENCOUNTER — Emergency Department (HOSPITAL_COMMUNITY)
Admission: EM | Admit: 2023-12-18 | Discharge: 2023-12-19 | Disposition: A | Discharge status: Home / Self Care | Attending: Emergency Medicine | Admitting: Emergency Medicine

## 2023-12-18 ENCOUNTER — Encounter (HOSPITAL_COMMUNITY): Payer: Self-pay

## 2023-12-18 DIAGNOSIS — F10920 Alcohol use, unspecified with intoxication, uncomplicated: Secondary | ICD-10-CM | POA: Insufficient documentation

## 2023-12-18 DIAGNOSIS — J45909 Unspecified asthma, uncomplicated: Secondary | ICD-10-CM | POA: Insufficient documentation

## 2023-12-18 DIAGNOSIS — Y907 Blood alcohol level of 200-239 mg/100 ml: Secondary | ICD-10-CM | POA: Insufficient documentation

## 2023-12-18 DIAGNOSIS — D72829 Elevated white blood cell count, unspecified: Secondary | ICD-10-CM | POA: Insufficient documentation

## 2023-12-18 DIAGNOSIS — R062 Wheezing: Secondary | ICD-10-CM

## 2023-12-18 DIAGNOSIS — F10129 Alcohol abuse with intoxication, unspecified: Secondary | ICD-10-CM | POA: Insufficient documentation

## 2023-12-18 DIAGNOSIS — Z91010 Allergy to peanuts: Secondary | ICD-10-CM | POA: Insufficient documentation

## 2023-12-18 NOTE — ED Triage Notes (Signed)
 PT BIB [facility] for alcohol intoxication.

## 2023-12-18 NOTE — ED Provider Notes (Signed)
 [place] EMERGENCY DEPARTMENT AT [facility] Provider Note   CSN: [id number] Arrival date & time: 12/18/23  9372     Patient presents with: Alcohol Intoxication  HPI Cristian Crawford is a 38 y.o. male presenting for alcohol intoxication.  States that he did have a few.  Patient appears to be intoxicated does have capacity.  Also endorses chest pain that he states he is having for a minute.  Denies shortness of breath.  Chest pain is all over his chest.  Denies abdominal pain.  Was noted to be agitated with EMS and has been verbally combative with staff and shouting obscenities.  Denies cocaine use.    Alcohol Intoxication       Prior to Admission medications  Medication Sig Start Date End Date Taking? Authorizing Provider  albuterol  (VENTOLIN  HFA) 108 (90 Base) MCG/ACT inhaler Inhale 1-2 puffs into the lungs every 6 (six) hours as needed for wheezing or shortness of breath. 12/06/22   Silver Wonda LABOR, PA  ibuprofen  (ADVIL ) 600 MG tablet Take 1 tablet (600 mg total) by mouth every 6 (six) hours as needed. 11/22/23   Horton, Charmaine FALCON, MD  multivitamin (ONE-A-DAY MEN'S) TABS tablet Take 1 tablet by mouth daily.    [provider]  naproxen sodium (ANAPROX) 220 MG tablet Take 220 mg by mouth daily as needed (pain).    [provider]  predniSONE  (DELTASONE ) 20 MG tablet Take 2 tablets (40 mg total) by mouth daily. 04/18/23   Geiple, Joshua, PA-C    Allergies: Peanut-containing drug products and Egg protein-containing drug products    Review of Systems See HPI  Updated Vital Signs BP (!) 138/90 (BP Location: Left Arm)   Pulse 97   Temp 98.5 F (36.9 C) (Oral)   Resp 18   SpO2 99%   Physical Exam Vitals and nursing note reviewed.  HENT:     Head: Normocephalic and atraumatic.     Mouth/Throat:     Mouth: Mucous membranes are moist.  Eyes:     General:        Right eye: No discharge.        Left eye: No discharge.      Conjunctiva/sclera: Conjunctivae normal.  Cardiovascular:     Rate and Rhythm: Normal rate and regular rhythm.     Pulses: Normal pulses.     Heart sounds: Normal heart sounds.  Pulmonary:     Effort: Pulmonary effort is normal.     Breath sounds: Normal breath sounds.  Abdominal:     General: Abdomen is flat.     Palpations: Abdomen is soft.  Skin:    General: Skin is warm and dry.  Neurological:     General: No focal deficit present.  Psychiatric:        Attention and Perception: Attention normal.        Mood and Affect: Mood normal. Affect is angry.        Behavior: Behavior is agitated.        Thought Content: Thought content normal.        Cognition and Memory: Cognition normal.        Judgment: Judgment normal.     Comments: Appears to be intoxicated.      (all labs ordered are listed, but only abnormal results are displayed) Labs Reviewed - No data to display  EKG: None  Radiology: No results found.   Procedures   Medications Ordered in the ED - No data to display  Medical Decision Making  38 year old well-appearing male presenting for alcohol intoxication.  Exam findings are concerning that he is intoxicated. Overall he looks well, no acute distress and vitals are reassuring. He does appear to have capacity and is able to answer questions but has been verbally combative with members of the staff including myself.  He did endorse chest pain. Ordered EKG but unfortunately patient eloped.       Final diagnoses:  Alcoholic intoxication without complication    ED Discharge Orders     None          Lang Norleen POUR, PA-C 12/18/23 0654    Palumbo, April, MD 12/18/23 [phone]

## 2023-12-18 NOTE — ED Triage Notes (Signed)
 Pt arrives via [facility] from hotel for alcohol intoxication. Pt called d/t feeling drunk. Became agitated with EMS.   EMS last VS 136/80, 97% on RA, HR 84, CBG 123

## 2023-12-19 ENCOUNTER — Ambulatory Visit (HOSPITAL_COMMUNITY)
Admission: EM | Admit: 2023-12-19 | Discharge: 2023-12-19 | Disposition: A | Discharge status: Other Acute Inpatient Hospital | Attending: Psychiatry | Admitting: Psychiatry

## 2023-12-19 ENCOUNTER — Encounter (HOSPITAL_COMMUNITY): Payer: Self-pay | Admitting: Psychiatry

## 2023-12-19 ENCOUNTER — Emergency Department (HOSPITAL_COMMUNITY)

## 2023-12-19 ENCOUNTER — Other Ambulatory Visit (HOSPITAL_COMMUNITY): Admission: EM | Admit: 2023-12-19 | Discharge: 2023-12-24 | Disposition: A | Source: Intra-hospital

## 2023-12-19 DIAGNOSIS — F1010 Alcohol abuse, uncomplicated: Secondary | ICD-10-CM | POA: Diagnosis not present

## 2023-12-19 DIAGNOSIS — J4520 Mild intermittent asthma, uncomplicated: Secondary | ICD-10-CM

## 2023-12-19 DIAGNOSIS — I10 Essential (primary) hypertension: Secondary | ICD-10-CM

## 2023-12-19 DIAGNOSIS — F1090 Alcohol use, unspecified, uncomplicated: Secondary | ICD-10-CM | POA: Diagnosis present

## 2023-12-19 DIAGNOSIS — F1020 Alcohol dependence, uncomplicated: Secondary | ICD-10-CM | POA: Diagnosis not present

## 2023-12-19 LAB — LIPID PANEL
Cholesterol: 130 mg/dL (ref 0–200)
HDL: 66 mg/dL (ref >40)
LDL Cholesterol: 55 mg/dL (ref 0–99)
Total CHOL/HDL Ratio: 2 ratio
Triglycerides: 43 mg/dL (ref <150)
VLDL: 9 mg/dL (ref 0–40)

## 2023-12-19 LAB — COMPREHENSIVE METABOLIC PANEL WITH GFR
ALT: 7 U/L (ref 0–44)
AST: 21 U/L (ref 15–41)
Albumin: 4.5 g/dL (ref 3.5–5.0)
Alkaline Phosphatase: 101 U/L (ref 38–126)
Anion gap: 14 (ref 5–15)
BUN: 11 mg/dL (ref 6–20)
CO2: 23 mmol/L (ref 22–32)
Calcium: 8.3 mg/dL — ABNORMAL LOW (ref 8.9–10.3)
Chloride: 107 mmol/L (ref 98–111)
Creatinine, Ser: 0.94 mg/dL (ref 0.61–1.24)
GFR, Estimated: >60 mL/min (ref >60)
Glucose, Bld: 82 mg/dL (ref 70–99)
Potassium: 3.8 mmol/L (ref 3.5–5.1)
Sodium: 145 mmol/L (ref 135–145)
Total Bilirubin: <0.2 mg/dL (ref 0.0–1.2)
Total Protein: 7.6 g/dL (ref 6.5–8.1)

## 2023-12-19 LAB — TSH: TSH: 0.149 u[IU]/mL — ABNORMAL LOW (ref 0.350–4.500)

## 2023-12-19 LAB — POCT URINE DRUG SCREEN - MANUAL ENTRY (I-SCREEN)
POC Amphetamine UR: NOT DETECTED
POC Buprenorphine (BUP): NOT DETECTED
POC Cocaine UR: NOT DETECTED
POC Marijuana UR: POSITIVE — AB
POC Methadone UR: NOT DETECTED
POC Methamphetamine UR: NOT DETECTED
POC Morphine: NOT DETECTED
POC Oxazepam (BZO): NOT DETECTED
POC Oxycodone UR: NOT DETECTED
POC Secobarbital (BAR): NOT DETECTED

## 2023-12-19 LAB — MAGNESIUM: Magnesium: 2.2 mg/dL (ref 1.7–2.4)

## 2023-12-19 LAB — CBC
HCT: 43 % (ref 39.0–52.0)
Hemoglobin: 14.5 g/dL (ref 13.0–17.0)
MCH: 31.2 pg (ref 26.0–34.0)
MCHC: 33.7 g/dL (ref 30.0–36.0)
MCV: 92.5 fL (ref 80.0–100.0)
Platelets: 385 K/uL (ref 150–400)
RBC: 4.65 MIL/uL (ref 4.22–5.81)
RDW: 13.1 % (ref 11.5–15.5)
WBC: 11.1 K/uL — ABNORMAL HIGH (ref 4.0–10.5)
nRBC: 0 % (ref 0.0–0.2)

## 2023-12-19 LAB — ETHANOL
Alcohol, Ethyl (B): 219 mg/dL — ABNORMAL HIGH (ref <15)
Alcohol, Ethyl (B): <15 mg/dL (ref <15)

## 2023-12-19 MED ORDER — LORAZEPAM 2 MG/ML IJ SOLN: 2.0000 mg | Freq: Three times a day (TID) | INTRAMUSCULAR | Status: DC | PRN

## 2023-12-19 MED ORDER — DIPHENHYDRAMINE HCL 50 MG/ML IJ SOLN: 50.0000 mg | Freq: Three times a day (TID) | INTRAMUSCULAR | Status: DC | PRN

## 2023-12-19 MED ORDER — HALOPERIDOL LACTATE 5 MG/ML IJ SOLN: 5.0000 mg | Freq: Three times a day (TID) | INTRAMUSCULAR | Status: DC | PRN

## 2023-12-19 MED ORDER — HALOPERIDOL 5 MG PO TABS: 5.0000 mg | ORAL_TABLET | Freq: Three times a day (TID) | ORAL | Status: DC | PRN

## 2023-12-19 MED ORDER — TRAZODONE HCL 50 MG PO TABS: 50.0000 mg | ORAL_TABLET | Freq: Every evening | ORAL | Status: DC | PRN

## 2023-12-19 MED ORDER — HYDROXYZINE HCL 25 MG PO TABS
25.0000 mg | ORAL_TABLET | Freq: Four times a day (QID) | ORAL | Status: AC | PRN
  Administered 2023-12-20 – 2023-12-21 (×2): 25 mg via ORAL
  Filled 2023-12-19 (×2): qty 1

## 2023-12-19 MED ORDER — ADULT MULTIVITAMIN W/MINERALS CH: 1.0000 | ORAL_TABLET | Freq: Every day | ORAL | Status: DC

## 2023-12-19 MED ORDER — ALBUTEROL SULFATE HFA 108 (90 BASE) MCG/ACT IN AERS: 1.0000 | INHALATION_SPRAY | Freq: Four times a day (QID) | RESPIRATORY_TRACT | Status: DC | PRN

## 2023-12-19 MED ORDER — ACETAMINOPHEN 325 MG PO TABS
650.0000 mg | ORAL_TABLET | Freq: Four times a day (QID) | ORAL | Status: DC | PRN
  Administered 2023-12-23: 04:00:00 650 mg via ORAL
  Filled 2023-12-19: qty 2

## 2023-12-19 MED ORDER — ALBUTEROL SULFATE HFA 108 (90 BASE) MCG/ACT IN AERS
1.0000 | INHALATION_SPRAY | RESPIRATORY_TRACT | Status: DC | PRN
  Administered 2023-12-20 – 2023-12-22 (×6): 2 via RESPIRATORY_TRACT

## 2023-12-19 MED ORDER — DIPHENHYDRAMINE HCL 50 MG PO CAPS: 50.0000 mg | ORAL_CAPSULE | Freq: Three times a day (TID) | ORAL | Status: DC | PRN

## 2023-12-19 MED ORDER — HYDROXYZINE HCL 25 MG PO TABS: 25.0000 mg | ORAL_TABLET | Freq: Four times a day (QID) | ORAL | Status: DC | PRN

## 2023-12-19 MED ORDER — IPRATROPIUM-ALBUTEROL 0.5-2.5 (3) MG/3ML IN SOLN
6.0000 mL | Freq: Once | RESPIRATORY_TRACT | Status: AC
  Administered 2023-12-19: 6 mL via RESPIRATORY_TRACT
  Filled 2023-12-19: qty 6

## 2023-12-19 MED ORDER — TRAZODONE HCL 50 MG PO TABS
50.0000 mg | ORAL_TABLET | Freq: Every evening | ORAL | Status: DC | PRN
  Administered 2023-12-20 – 2023-12-23 (×4): 50 mg via ORAL
  Filled 2023-12-19 (×4): qty 1
  Filled 2023-12-19: qty 7

## 2023-12-19 MED ORDER — IBUPROFEN 600 MG PO TABS: 600.0000 mg | ORAL_TABLET | Freq: Four times a day (QID) | ORAL | Status: DC | PRN

## 2023-12-19 MED ORDER — HALOPERIDOL LACTATE 5 MG/ML IJ SOLN: 10.0000 mg | Freq: Three times a day (TID) | INTRAMUSCULAR | Status: DC | PRN

## 2023-12-19 MED ORDER — LOPERAMIDE HCL 2 MG PO CAPS: 2.0000 mg | ORAL_CAPSULE | ORAL | Status: DC | PRN

## 2023-12-19 MED ORDER — LORAZEPAM 1 MG PO TABS: 1.0000 mg | ORAL_TABLET | Freq: Four times a day (QID) | ORAL | Status: DC | PRN

## 2023-12-19 MED ORDER — ONDANSETRON 4 MG PO TBDP: 4.0000 mg | ORAL_TABLET | Freq: Four times a day (QID) | ORAL | Status: AC | PRN

## 2023-12-19 MED ORDER — ALUM & MAG HYDROXIDE-SIMETH 200-200-20 MG/5ML PO SUSP: 30.0000 mL | ORAL | Status: DC | PRN

## 2023-12-19 MED ORDER — NALTREXONE HCL 50 MG PO TABS
50.0000 mg | ORAL_TABLET | Freq: Every day | ORAL | Status: DC
  Administered 2023-12-20 – 2023-12-23 (×4): 50 mg via ORAL
  Filled 2023-12-19: qty 1
  Filled 2023-12-19: qty 7
  Filled 2023-12-19 (×3): qty 1

## 2023-12-19 MED ORDER — MAGNESIUM HYDROXIDE 400 MG/5ML PO SUSP: 30.0000 mL | Freq: Every day | ORAL | Status: DC | PRN

## 2023-12-19 MED ORDER — LOPERAMIDE HCL 2 MG PO CAPS: 2.0000 mg | ORAL_CAPSULE | ORAL | Status: AC | PRN

## 2023-12-19 MED ORDER — ONDANSETRON 4 MG PO TBDP: 4.0000 mg | ORAL_TABLET | Freq: Four times a day (QID) | ORAL | Status: DC | PRN

## 2023-12-19 MED ORDER — ADULT MULTIVITAMIN W/MINERALS CH
1.0000 | ORAL_TABLET | Freq: Every day | ORAL | Status: DC
  Administered 2023-12-19 – 2023-12-23 (×5): 1 via ORAL
  Filled 2023-12-19: qty 7
  Filled 2023-12-19 (×5): qty 1

## 2023-12-19 MED ORDER — LORAZEPAM 1 MG PO TABS: 1.0000 mg | ORAL_TABLET | Freq: Four times a day (QID) | ORAL | Status: AC | PRN

## 2023-12-19 MED ORDER — THIAMINE MONONITRATE 100 MG PO TABS
100.0000 mg | ORAL_TABLET | Freq: Every day | ORAL | Status: DC
  Administered 2023-12-20 – 2023-12-23 (×4): 100 mg via ORAL
  Filled 2023-12-19 (×2): qty 1
  Filled 2023-12-19: qty 7
  Filled 2023-12-19 (×2): qty 1

## 2023-12-19 MED ORDER — THIAMINE MONONITRATE 100 MG PO TABS: 100.0000 mg | ORAL_TABLET | Freq: Every day | ORAL | Status: DC

## 2023-12-19 MED ORDER — ACETAMINOPHEN 325 MG PO TABS: 650.0000 mg | ORAL_TABLET | Freq: Four times a day (QID) | ORAL | Status: DC | PRN

## 2023-12-19 MED ORDER — ALBUTEROL SULFATE HFA 108 (90 BASE) MCG/ACT IN AERS
1.0000 | INHALATION_SPRAY | Freq: Once | RESPIRATORY_TRACT | Status: AC
  Administered 2023-12-19: 2 via RESPIRATORY_TRACT
  Filled 2023-12-19: qty 6.7

## 2023-12-19 NOTE — Discharge Instructions (Signed)
Pt transferred to [facility]. 

## 2023-12-19 NOTE — Group Note (Signed)
 Group Topic: Recovery Basics  Group Date: 12/19/2023 Start Time: 2015 End Time: 2115 Facilitators: Nathanael Moulder, NT  Department: [facility]  Number of Participants: 6  Group Focus: community group, coping skills, relapse prevention, and social skills Treatment Modality:  Psychoeducation and Skills Training Interventions utilized were support Purpose: express feelings, relapse prevention strategies  Name: Cristian Crawford Date of Birth: July 11, 1985  MR: [id number]    Level of Participation: didn't attend Quality of Participation: didn't attend Interactions with others: didn't attend Mood/Affect: didn't attend Triggers (if applicable): didn't attend Cognition: didn't attend Progress: None Response: didn't attend Plan: patient will be encouraged to attend future groups  Patients Problems:  Patient Active Problem List   Diagnosis Date Noted   Alcohol use disorder, moderate, dependence (HCC) 12/19/2023   Alcohol use disorder 12/19/2023

## 2023-12-19 NOTE — BH Assessment (Signed)
 Comprehensive Clinical Assessment (CCA) Note  12/19/2023 Cristian Crawford [id number]  Disposition:  Per Alan Mcardle, NP, Facility Based Crisis Center Placement is recommended  The patient demonstrates the following risk factors for suicide: Chronic risk factors for suicide include: substance use disorder. Acute risk factors for suicide include: loss (financial, interpersonal, professional) and legal issue. Protective factors for this patient include: hope for the future. Considering these factors, the overall suicide risk at this point appears to be low. Patient is not appropriate for outpatient follow up due to his posine  PHQ2-9    Flowsheet Row ED from 12/19/2023 in [facility] Most recent reading at 12/19/2023 12:00 PM ED from 12/19/2023 in [facility] Most recent reading at 12/19/2023 11:59 AM  PHQ-2 Total Score 2 3  PHQ-9 Total Score 5 6   Flowsheet Row ED from 12/19/2023 in [facility] Most recent reading at 12/19/2023 10:29 AM ED from 12/18/2023 in [facility] Emergency Department at [facility] Most recent reading at 12/18/2023 10:45 PM ED from 12/18/2023 in [facility] Emergency Department at [facility] Most recent reading at 12/18/2023  6:33 AM  C-SSRS RISK CATEGORY No Risk No Risk No Risk     Chief Complaint:  Chief Complaint  Patient presents with   Alcohol Problem   Visit Diagnosis: F10.20 Alcohol Use Disorder Moderate    CCA Screening, Triage and Referral (STR)  Patient Reported Information How did you hear about us ? Self  What Is the Reason for Your Visit/Call Today? Cristian Crawford 38y male presents to [facility] unaccompanied, looking to detox for placement at [facility]. PT shares that when he drinks he overdoes it everytime; mostly beer, sometimes liquor. PT denies SI, HI, AVH.  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  Patient states that he was sober from alcohol for the past seven months, but states that he relapsed a couple days ago and states that he drank eight beers and a pint last night.  He went to the ED last night trying to get help, but was worked up and medically cleared in the ED and was discharged with resources to follow-up with.  Patient states that he is tired and just wants to get his life together.  He came to the [facility] seeking help. Patient does admit to smoking a blunt of marijuana last night, but did not provide any specifics of his use.  Patient denies experiencing any current withdrawal symptoms of complications and denies any history of withdrawal seizures or DTs.  Patient denies any previous history of mental illness and denies any prior history of depression.  He denies that he has been on any mental health medications and denies that he currently has a mental health provider.  Patient states that he averages 5-6 hours of sleep at night, but states that his appetite is good.  He denies any history of abuse or self-mutilating behaviors.  Patient states that he was raised by both parents.  He has three siblings, two brothers and a sister.  He is the third of four children.  Patient states that he completed high school and attended some college.  He states that he is employed at the [employer].  Patient states that he has never been married, but states that he has three children ages 53, 67 and 52.  Patient admits that he has child support issues and that he is in the rears.  He denied any other legal involvement, but was arrested on 11/24 for second degree trespassing and communicating threats.  It appears that his court date is 12/26/2023.  Patient is alert and oriented.  His mood is appropriate to circumstances.  His judgment, insight and impulse control are impaired by his substance use.   His thoughts are organized and his memory intact.  He does not appear to be responding to any internal stimuli.  His speech is coherent and normal in rate, tone and volume and his eye contact is good.   How Long Has This Been Causing You Problems? <Week  What Do You Feel Would Help You the Most Today? Alcohol or Drug Use Treatment   Have You Recently Had Any Thoughts About Hurting Yourself? No  Are You Planning to Commit Suicide/Harm Yourself At This time? No   Flowsheet Row ED from 12/19/2023 in [facility] Most recent reading at 12/19/2023 10:29 AM ED from 12/18/2023 in [facility] Emergency Department at [facility] Most recent reading at 12/18/2023 10:45 PM ED from 12/18/2023 in [facility] Emergency Department at [facility] Most recent reading at 12/18/2023  6:33 AM  C-SSRS RISK CATEGORY No Risk No Risk No Risk    Have you Recently Had Thoughts About Hurting Someone Sherral? No  Are You Planning to Harm Someone at This Time? No  Explanation: Patient denies any history of SI/HI   Have You Used Any Alcohol or Drugs in the Past 24 Hours? Yes  How Long Ago Did You Use Drugs or Alcohol? states that he drank eight beers last night and a pint of liqour, also smoked 1 blunt of marijuana  What Did You Use and How Much? states that he drank eight beers last night and a pint of liqour, also smoked 1 blunt of marijuana   Do You Currently Have a Therapist/Psychiatrist? No  Name of Therapist/Psychiatrist:    Have You Been Recently Discharged From Any Office Practice or Programs? No  Explanation of Discharge From Practice/Program: No data recorded    CCA Screening Triage Referral Assessment Type of Contact: Face-to-Face  Telemedicine Service Delivery:   Is this Initial or Reassessment?   Date Telepsych consult ordered in [facility]:    Time Telepsych consult ordered in [facility]:    Location of Assessment: [facility] Assessment Services  Provider  Location: [facility] Assessment Services   Collateral Involvement: none available   Does Patient Have a Automotive Engineer Guardian? No  Legal Guardian Contact Information: NA  Copy of Legal Guardianship Form: No - copy requested (NA)  Legal Guardian Notified of Arrival: -- (NA)  Legal Guardian Notified of Pending Discharge: -- (NA)  If Minor and Not Living with Parent(s), Who has Custody? NA  Is CPS involved or ever been involved? Never  Is APS involved or ever been involved? Never   Patient Determined To Be At Risk for Harm To Self or Others Based on Review of Patient Reported Information or Presenting Complaint? No  Method: No Plan  Availability of Means: No access or NA  Intent: Vague intent or NA  Notification Required: No need or identified person  Additional Information for Danger to Others Potential: -- (NA)  Additional Comments for Danger to Others Potential: NA  Are There Guns or Other Weapons in Your Home? No  Types of Guns/Weapons: NA  Are These Weapons Safely Secured?  No  Who Could Verify You Are Able To Have These Secured: NA  Do You Have any Outstanding Charges, Pending Court Dates, Parole/Probation? NA  Contacted To Inform of Risk of Harm To Self or Others: Other: Comment (NA)    Does Patient Present under Involuntary Commitment? No data recorded   [place] of Residence: [place]   Patient Currently Receiving the Following Services: Not Receiving Services   Determination of Need: Urgent (48 hours)   Options For Referral: Chemical Dependency Intensive Outpatient Therapy (CDIOP); Facility-Based Crisis     CCA Biopsychosocial Patient Reported Schizophrenia/Schizoaffective Diagnosis in Past: No   Strengths: Patient has a desire for help and to change his life   Mental Health Symptoms Depression:  None   Duration of Depressive symptoms:    Mania:  None   Anxiety:   None   Psychosis:  None    Duration of Psychotic symptoms:    Trauma:  None   Obsessions:  None   Compulsions:  None   Inattention:  N/A   Hyperactivity/Impulsivity:  N/A   Oppositional/Defiant Behaviors:  N/A   Emotional Irregularity:  Intense/unstable relationships   Other Mood/Personality Symptoms:  patient is cooperative and his mood is appropriate to circumstances    Mental Status Exam Appearance and self-care  Stature:  Average   Weight:  Thin   Clothing:  Neat/clean   Grooming:  Normal   Cosmetic use:  None   Posture/gait:  Normal   Motor activity:  Not Remarkable   Sensorium  Attention:  Normal   Concentration:  Normal   Orientation:  Object; Person; Place; Situation; Time   Recall/memory:  Normal   Affect and Mood  Affect:  Appropriate   Mood:  -- (mood appropriate to circumstances)   Relating  Eye contact:  Normal   Facial expression:  Responsive   Attitude toward examiner:  Cooperative   Thought and Language  Speech flow: Clear and Coherent   Thought content:  Appropriate to Mood and Circumstances   Preoccupation:  None   Hallucinations:  None   Organization:  Intact; Goal-directed   Company Secretary of Knowledge:  Average   Intelligence:  Average   Abstraction:  Normal   Judgement:  Impaired   Reality Testing:  Realistic   Insight:  Flashes of insight   Decision Making:  Impulsive   Social Functioning  Social Maturity:  Responsible   Social Judgement:  Chief Of Staff   Stress  Stressors:  Armed Forces Operational Officer; Relationship; Financial   Coping Ability:  Overwhelmed   Skill Deficits:  Decision making; Self-control   Supports:  Family     Religion: Religion/Spirituality Are You A Religious Person?: Yes What is Your Religious Affiliation?: Christian How Might This Affect Treatment?: NA  Leisure/Recreation: Leisure / Recreation Do You Have Hobbies?: Yes Leisure and Hobbies: sports and fishing  Exercise/Diet: Exercise/Diet Do You  Exercise?: No Have You Gained or Lost A Significant Amount of Weight in the Past Six Months?: No Do You Follow a Special Diet?: No Do You Have Any Trouble Sleeping?: Yes Explanation of Sleeping Difficulties: only sleeps five to six hours at night   CCA Employment/Education Employment/Work Situation: Employment / Work Situation Employment Situation: Employed Work Stressors: states that he works at [employer] Job has Been Impacted by Current Illness: No Has Patient ever Been in the [employer]?: No  Education: Education Is Patient Currently Attending School?: No Last Grade Completed:  (some college) Did Theme Park Manager?: Yes What Type of [employer]  Degree Do you Have?: no degree Did You Have An Individualized Education Program (IIEP): No Did You Have Any Difficulty At School?: No Patient's Education Has Been Impacted by Current Illness: No   CCA Family/Childhood History Family and Relationship History: Family history Marital status: Single Does patient have children?: Yes How many children?: 3 How is patient's relationship with their children?: has two children age 58 and states that he has a 77 year old, owes child support  Childhood History:  Childhood History By whom was/is the patient raised?: Both parents Did patient suffer any verbal/emotional/physical/sexual abuse as a child?: No Did patient suffer from severe childhood neglect?: No Has patient ever been sexually abused/assaulted/raped as an adolescent or adult?: No Was the patient ever a victim of a crime or a disaster?: No Witnessed domestic violence?: No Has patient been affected by domestic violence as an adult?: No       CCA Substance Use Alcohol/Drug Use: Alcohol / Drug Use Pain Medications: see MAR Prescriptions: see MAR Over the Counter: see MAR History of alcohol / drug use?: Yes Longest period of sobriety (when/how long): states that he has been clean from cocaine for the  past year Negative Consequences of Use: Financial, Legal, Personal relationships Withdrawal Symptoms: None (no withdrawal complications) Substance #1 Name of Substance 1: alcohol 1 - Age of First Use: not disclosed 1 - Amount (size/oz): states that he was sober x 7 months, but started drinking this pat Wednesday, 2 days ago. 1 - Frequency: Has been drinking daily for the past couple days 1 - Duration: 2 days 1 - Last Use / Amount: eight beers and 1 pint 1 - Method of Aquiring: purchases 1- Route of Use: oral Substance #2 Name of Substance 2: marijuana 2 - Age of First Use: not disclosed 2 - Amount (size/oz): 1 blunt 2 - Frequency: occasionally 2 - Duration: unknown 2 - Last Use / Amount: 1 blunt last night 2 - Method of Aquiring: unknown 2 - Route of Substance Use: smoke Substance #3 Name of Substance 3: cocaine 3 - Age of First Use: not disclosed 3 - Amount (size/oz): unknown 3 - Frequency: unknown 3 - Duration: unknown 3 - Last Use / Amount: states that he has not snorted cocaine in 1 year 3 - Method of Aquiring: unknown 3 - Route of Substance Use: snort                   ASAM's:  Six Dimensions of Multidimensional Assessment  Dimension 1:  Acute Intoxication and/or Withdrawal Potential:   Dimension 1:  Description of individual's past and current experiences of substance use and withdrawal: Patient denies that he is experiencing any compliations with withdrawal symptoms  Dimension 2:  Biomedical Conditions and Complications:   Dimension 2:  Description of patient's biomedical conditions and  complications: Patient denies having any current medical complications  Dimension 3:  Emotional, Behavioral, or Cognitive Conditions and Complications:  Dimension 3:  Description of emotional, behavioral, or cognitive conditions and complications: Patient denies any mental health history or treatment  Dimension 4:  Readiness to Change:  Dimension 4:  Description of Readiness to  Change criteria: Patient states that he wants to get help and stop using alcohol and drugs  Dimension 5:  Relapse, Continued use, or Continued Problem Potential:  Dimension 5:  Relapse, continued use, or continued problem potential critiera description: Patient has a history of chronic relapses.  Compeleted a residential program in the past and continues to use  Dimension 6:  Recovery/Living Environment:  Dimension 6:  Recovery/Iiving environment criteria description: Patient states that he has a safe and stable environment in which to live and his family is supportive  ASAM Severity Score: ASAM's Severity Rating Score: 6  ASAM Recommended Level of Treatment:     Substance use Disorder (SUD) Substance Use Disorder (SUD)  Checklist Symptoms of Substance Use: Continued use despite having a persistent/recurrent physical/psychological problem caused/exacerbated by use, Continued use despite persistent or recurrent social, interpersonal problems, caused or exacerbated by use, Persistent desire or unsuccessful efforts to cut down or control use, Presence of craving or strong urge to use, Recurrent use that results in a failure to fulfill major role obligations (work, school, home), Substance(s) often taken in larger amounts or over longer times than was intended  Recommendations for Services/Supports/Treatments: Recommendations for Services/Supports/Treatments Recommendations For Services/Supports/Treatments: CD-IOP Intensive Chemical Dependency Program, Residential-Level 3  Disposition Recommendation per psychiatric provider: We recommend transfer to [facility].   DSM5 Diagnoses: Patient Active Problem List   Diagnosis Date Noted   Alcohol use disorder, moderate, dependence (HCC) 12/19/2023     Referrals to Alternative Service(s): Referred to Alternative Service(s):   Place:   Date:   Time:    Referred to Alternative Service(s):   Place:   Date:   Time:    Referred  to Alternative Service(s):   Place:   Date:   Time:    Referred to Alternative Service(s):   Place:   Date:   Time:     Lynasia Meloche J Frannie Shedrick, LCAS

## 2023-12-19 NOTE — BHH Group Notes (Signed)
 Spiritual Care and Counseling Group Note  12/19/2023 2:45pm  Facilitated by: Librada Donnice Lin   Type of Therapy and Topic:  Hope    Participation Level:  Did Not Attend  Description of Group:  Group focused on topic of hope.  Patients participated in facilitated discussion around topic, connecting with one another around experiences and definitions for hope.  Group members engaged with group word cloud.  Members selected an image of what hope looks like for them today.  Group engaged in discussion around how their definitions of hope are present today in hospital.    Summary of Patient Progress:  DID NOT ATTEND    Therapeutic Modalities: Psycho-social ed, Adlerian, Narrative, MI   Chelsey, Kimberley, Chaplain 12/19/2023 4:07 PM

## 2023-12-19 NOTE — Group Note (Signed)
 Group Topic: Healthy Self Image and Positive Change  Group Date: 12/19/2023 Start Time: 1300 End Time: 1345 Facilitators: Alyse Leilani LABOR, NT  Department: [facility]  Number of Participants: 6  Group Focus: chemical dependency education, chemical dependency issues, clarity of thought, reality orientation, and relapse prevention Treatment Modality:  Psychoeducation Interventions utilized were group exercise Purpose: enhance coping skills, express feelings, improve communication skills, and increase insight  Name: Cristian Crawford Date of Birth: 01-06-1986  MR: [id number]    Level of Participation: minimal Quality of Participation: drowsy Interactions with others: pt was sleep at the table Mood/Affect: restless Triggers (if applicable): listened Cognition: not focused Progress: Minimal Response: none Plan: patient will be encouraged to keep coming to groups.  Patients Problems:  Patient Active Problem List   Diagnosis Date Noted   Alcohol use disorder, moderate, dependence (HCC) 12/19/2023   Alcohol use disorder 12/19/2023

## 2023-12-19 NOTE — ED Provider Notes (Signed)
 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 Pt then admitted and transferred to Facility Based Crisis unit. [name], is seen face to face by this provider and chart reviewed on 12/19/2023. Pt has a past psychiatric history of ADHD, AUD, THC use and GAD. Medical hx pertinent for asthma.  Pt has multiple ER visits with alcohol intoxication. Most recent was yesterday when he had a BAL of 219. UDS positive for THC.   On evaluation Darren Caldron reports that he left the ER this morning and went to [facility] for residential alcohol use treatment. He was referred by [facility] to come here to detox first then transfer there. He reports OK sleep and good appetite. He denies suicidal ideations, homicidal ideations and psychotic symptoms. Pt states that he was clean for about 7 months and recently relapsed a month ago on alcohol. Pt reports that his last alcohol intake was yesterday but is unsure of how much he consumed but reports drinking quite a few beers and liquor, per TTS is was 8 beers and a pint of liquor.  Pt states that he usually binge drinks alcohol a few times a week. He also endorses daily THC use. Pt denies hx of complicated withdrawals, DT's and seizures. He denies current symptoms of alcohol withdrawal. No signs of withdrawal observed. Pt previously completed residential program at [facility] for cocaine and alcohol use last year.   Pt reports a hx of anxiety and ADHD for which he was trialed on Buspar, Strattera and Qelbree in May 2025 but did not feel much difference so he  discontinued treatment. Today pt denies concerns involving his mental health and does not feel that he needs treatment for depression and anxiety. His PHQ-9 scored a 5 indicating very mild depressive symptoms which are likely situational and due to his alcohol relapse. He denies hx of self harm and suicide attempts. Pt also denies history of inpatient psychiatric treatment.   During evaluation Cristian Crawford is sitting up is assessment room, in no acute distress.  He is alert & oriented x 4, calm, cooperative and attentive for this assessment.  His mood is euthymic with congruent affect.  He has normal speech, and behavior.  Objectively there is no evidence of psychosis/mania or delusional thinking. Pt does not appear to be responding to internal or external stimuli.  Patient is able to converse coherently, goal directed thoughts, no distractibility, or pre-occupation.  He denies current  suicidal/self-harm/homicidal ideation, psychosis, and paranoia.  Patient answered assessment questions appropriately.       PHQ 2-9:   Flowsheet Row ED from 12/19/2023 in [facility] Most recent reading at 12/19/2023 10:29 AM ED from 12/18/2023 in [facility] at [facility] Most recent reading at 12/18/2023 10:45 PM ED from 12/18/2023 in [facility] Emergency Department at [facility] Most recent reading at 12/18/2023  6:33 AM  C-SSRS RISK CATEGORY No Risk No Risk No Risk      Total Time spent with patient: 45 minutes  Musculoskeletal  Strength & Muscle Tone: within normal limits Gait & Station: normal Patient leans: N/A  Psychiatric  Specialty Exam  Presentation General Appearance:  Appropriate for Environment  Eye Contact: Good  Speech: Clear and Coherent  Speech Volume: Normal  Handedness: Right   Mood and Affect  Mood: Euthymic  Affect: Appropriate   Thought Process  Thought Processes: Coherent;  Linear  Descriptions of Associations:Intact  Orientation:Full (Time, Place and Person)  Thought Content:WDL    Hallucinations:Hallucinations: None  Ideas of Reference:None  Suicidal Thoughts:Suicidal Thoughts: No  Homicidal Thoughts:Homicidal Thoughts: No   Sensorium  Memory: Immediate Good; Recent Good  Judgment: Fair  Insight: Fair   Art Therapist  Concentration: Good  Attention Span: Good  Recall: Good  Fund of Knowledge: Fair  Language: Good   Psychomotor Activity  Psychomotor Activity: Psychomotor Activity: Normal   Assets  Assets: Desire for Improvement; Housing; Physical Health; Social Support; Manufacturing Systems Engineer; Financial Resources/Insurance   Sleep  Sleep: Sleep: Good Number of Hours of Sleep: 8   Nutritional Assessment (For OBS and FBC admissions only) Has the patient had a weight loss or gain of 10 pounds or more in the last 3 months?: No Has the patient had a decrease in food intake/or appetite?: No Does the patient have dental problems?: No Does the patient have eating habits or behaviors that may be indicators of an eating disorder including binging or inducing vomiting?: No Has the patient recently lost weight without trying?: 0 Has the patient been eating poorly because of a decreased appetite?: 0 Malnutrition Screening Tool Score: 0    Physical Exam Vitals and nursing note reviewed.  Constitutional:      Appearance: Normal appearance.  HENT:     Head: Normocephalic.     Nose: Nose normal.  Eyes:     Extraocular Movements: Extraocular movements intact.  Cardiovascular:     Rate and Rhythm: Normal rate.  Pulmonary:     Effort: Pulmonary effort is normal.  Musculoskeletal:        General: Normal range of motion.     Cervical back: Normal range of motion.  Neurological:     General: No focal deficit present.     Mental Status: He is alert and oriented to person, place, and time.    Review of Systems   Constitutional: Negative.   HENT: Negative.    Eyes: Negative.   Respiratory: Negative.    Cardiovascular: Negative.   Gastrointestinal: Negative.   Genitourinary: Negative.   Musculoskeletal: Negative.   Neurological: Negative.   Endo/Heme/Allergies: Negative.   Psychiatric/Behavioral:  Positive for substance abuse.     Blood pressure (!) 129/94, pulse (!) 110, temperature 98.6 F (37 C), temperature source Oral, resp. rate 16, SpO2 95%. There is no height or weight on file to calculate BMI.  Past Psychiatric History: Hx of GAD, AUD and ADHD. Pt previously prescribed Buspar, Naltrexone, Strattera and Qelbree. No inpatient psychiatric history.   Is the patient at risk to self? No  Has the patient been a risk to self in the past 6 months? No .    Has the patient been a risk to self within the distant past? No   Is the patient a risk to others? No   Has the patient been a risk to others in the past 6 months? No   Has the patient been a risk to others within the distant past? No   Past Medical History: Asthma  Family History:Pt denies knowledge of any family medical or psych hx.  Social History: Pt reports that he is currently employed and living with his older  brother. He does have children and pays child support. Reports alcohol and THC use. Pt denies legal issues but found that pt has a court date this month for communicating threats.   Last Labs:  Admission on 12/19/2023  Component Date Value Ref Range Status   POC Amphetamine UR 12/19/2023 None Detected  NONE DETECTED (Cut Off Level 1000 ng/mL) Final   POC Secobarbital (BAR) 12/19/2023 None Detected  NONE DETECTED (Cut Off Level 300 ng/mL) Final   POC Buprenorphine (BUP) 12/19/2023 None Detected  NONE DETECTED (Cut Off Level 10 ng/mL) Final   POC Oxazepam (BZO) 12/19/2023 None Detected  NONE DETECTED (Cut Off Level 300 ng/mL) Final   POC Cocaine UR 12/19/2023 None Detected  NONE DETECTED (Cut Off Level 300 ng/mL) Final   POC  Methamphetamine UR 12/19/2023 None Detected  NONE DETECTED (Cut Off Level 1000 ng/mL) Final   POC Morphine 12/19/2023 None Detected  NONE DETECTED (Cut Off Level 300 ng/mL) Final   POC Methadone UR 12/19/2023 None Detected  NONE DETECTED (Cut Off Level 300 ng/mL) Final   POC Oxycodone UR 12/19/2023 None Detected  NONE DETECTED (Cut Off Level 100 ng/mL) Final   POC Marijuana UR 12/19/2023 Positive (A)  NONE DETECTED (Cut Off Level 50 ng/mL) Final  Admission on 12/18/2023, Discharged on 12/19/2023  Component Date Value Ref Range Status   Sodium 12/18/2023 145  135 - 145 mmol/L Final   Potassium 12/18/2023 3.8  3.5 - 5.1 mmol/L Final   Chloride 12/18/2023 107  98 - 111 mmol/L Final   CO2 12/18/2023 23  22 - 32 mmol/L Final   Glucose, Bld 12/18/2023 82  70 - 99 mg/dL Final   Glucose reference range applies only to samples taken after fasting for at least 8 hours.   BUN 12/18/2023 11  6 - 20 mg/dL Final   Creatinine, Ser 12/18/2023 0.94  0.61 - 1.24 mg/dL Final   Calcium 87/88/7974 8.3 (L)  8.9 - 10.3 mg/dL Final   Total Protein 87/88/7974 7.6  6.5 - 8.1 g/dL Final   Albumin 87/88/7974 4.5  3.5 - 5.0 g/dL Final   AST 87/88/7974 21  15 - 41 U/L Final   ALT 12/18/2023 7  0 - 44 U/L Final   Alkaline Phosphatase 12/18/2023 101  38 - 126 U/L Final   Total Bilirubin 12/18/2023 <0.2  0.0 - 1.2 mg/dL Final   GFR, Estimated 12/18/2023 >60  >60 mL/min Final   Comment: (NOTE) Calculated using the CKD-EPI Creatinine Equation (2021)    Anion gap 12/18/2023 14  5 - 15 Final   Performed at [facility], [address] [place], [place] [address]   Alcohol, Ethyl (B) 12/18/2023 219 (H)  <15 mg/dL Final   Comment: (NOTE) For medical purposes only. Performed at [facility], [address] [place], [place] [address]    WBC 12/18/2023 11.1 (H)  4.0 - 10.5 K/uL Final   RBC 12/18/2023 4.65  4.22 - 5.81 MIL/uL Final   Hemoglobin 12/18/2023 14.5  13.0 - 17.0 g/dL Final    HCT 87/88/7974 43.0  39.0 - 52.0 % Final   MCV 12/18/2023 92.5  80.0 - 100.0 fL Final   MCH 12/18/2023 31.2  26.0 - 34.0 pg Final   MCHC 12/18/2023 33.7  30.0 - 36.0 g/dL Final   RDW 87/88/7974 13.1  11.5 - 15.5 % Final   Platelets 12/18/2023 385  150 - 400 K/uL Final   nRBC 12/18/2023 0.0  0.0 - 0.2 %  Final   Performed at [facility], [address] [place], [place] [address]    Allergies: Egg protein-containing drug products, Other, and Peanut-containing drug products  Medications:  Facility Ordered Medications  Medication   [COMPLETED] ipratropium-albuterol  (DUONEB) 0.5-2.5 (3) MG/3ML nebulizer solution 6 mL   [COMPLETED] albuterol  (VENTOLIN  HFA) 108 (90 Base) MCG/ACT inhaler 1-2 puff   acetaminophen (TYLENOL) tablet 650 mg   alum & mag hydroxide-simeth (MAALOX/MYLANTA) 200-200-20 MG/5ML suspension 30 mL   magnesium hydroxide (MILK OF MAGNESIA) suspension 30 mL   [START ON 12/20/2023] thiamine (VITAMIN B1) tablet 100 mg   multivitamin with minerals tablet 1 tablet   LORazepam (ATIVAN) tablet 1 mg   hydrOXYzine (ATARAX) tablet 25 mg   loperamide (IMODIUM) capsule 2-4 mg   ondansetron (ZOFRAN-ODT) disintegrating tablet 4 mg   haloperidol (HALDOL) tablet 5 mg   And   diphenhydrAMINE (BENADRYL) capsule 50 mg   haloperidol lactate (HALDOL) injection 5 mg   And   diphenhydrAMINE (BENADRYL) injection 50 mg   And   LORazepam (ATIVAN) injection 2 mg   haloperidol lactate (HALDOL) injection 10 mg   And   diphenhydrAMINE (BENADRYL) injection 50 mg   And   LORazepam (ATIVAN) injection 2 mg   traZODone (DESYREL) tablet 50 mg   albuterol  (VENTOLIN  HFA) 108 (90 Base) MCG/ACT inhaler 1-2 puff   ibuprofen  (ADVIL ) tablet 600 mg    Long Term Goals: Improvement in symptoms so as ready for discharge  Short Term Goals: Patient will verbalize feelings in meetings with treatment team members., Patient will attend at least of 50% of the groups daily., Pt will complete  the PHQ9 on admission, day 3 and discharge., Patient will participate in completing the [place] Suicide Severity Rating Scale, Patient will score a low risk of violence for 24 hours prior to discharge, and Patient will take medications as prescribed daily.  Medical Decision Making  Pt is recommended for admission to the Facility Based Crisis unit for alcohol detox. Pt has struggled off and on with alcohol use for about 10 years. Pt was referred to [facility] for detox by [facility] with plans to transfer to [facility] for residential treatment. He was recently sober for about 7 months but relapsed on alcohol this about 1 month ago requiring him to present to ER settings for alcohol intoxication 6 times in the past 2 months. Pt last consumed 8 beers and a pint of liquor yesterday. He was assessed at the ED with a BAL of 219. He denies current withdrawal symptoms and hx of withdrawal seizures/ complications. Pt denies SI/HI and psychotic symptoms.   Plans:   - Admit to [facility] for alcohol detox - SW team to coordinate with [facility] for residential treatment - Initiate CIWA with PRN Ativan for withdrawal monitoring  -Initiate agitation protocol per policy -  Lab Orders         TSH         Ethanol         Magnesium         Lipid panel         POCT Urine Drug Screen - (I-Screen)     - Labs reviewed from ED visit on 12/18/23: BAL 219, WBC slightly elevated at 11.1 - Medications:   - Start multivitamin daily for nutrient replenishment - Start naltrexone 50 mg daily for alcohol cravings - Start thiamine 100 mg daily for supplementation - As needed medications:  - Tylenol 650 mg Q6 as needed for pain  -  Albuterol  inhaler every 4 as needed for wheezing or SOB  - Hydroxyzine 25 mg every 6 hours as needed for anxiety  - Loperamide 2-4 mg as needed for diarrhea  - Ondansetron 4 mg every 6 hours as needed for nausea vomiting  - Trazodone 50 mg nightly as needed for sleep  - Milk of magnesia daily as needed for  constipation  - Maalox every 4 hours as needed for indigestion  Recommendations  Based on my evaluation the patient does not appear to have an emergency medical condition.  Alan JAYSON Mcardle, NP 12/19/2023  11:42 AM

## 2023-12-19 NOTE — Group Note (Signed)
 Group Topic: Relaxation  Group Date: 12/19/2023 Start Time: 1200 End Time: 1230 Facilitators: Reinhold Minor BROCKS, RN  Department: [facility]  Number of Participants: 6  Group Focus: relaxation Treatment Modality:  Psychoeducation Interventions utilized were patient education Purpose: reinforce self-care  Name: Cristian Crawford Date of Birth: 13-Jul-1985  MR: [id number]    Level of Participation: minimal Quality of Participation: cooperative Interactions with others: gave feedback Mood/Affect: appropriate Triggers (if applicable): NA Cognition: goal directed Progress: Moderate Response: NA Plan: patient will be encouraged to practice relaxation techniques regularly  Patients Problems:  Patient Active Problem List   Diagnosis Date Noted   Alcohol use disorder, moderate, dependence (HCC) 12/19/2023   Alcohol use disorder 12/19/2023

## 2023-12-19 NOTE — Care Management (Signed)
 [facility] Care Management...  Writer met with patient to discuss discharge planning  Patient reported speaking with Rosaline at [facility] and they requested he comes her for detox.   Patient reported needing help with alcohol, inpatient treatment. Patient reported living in [facility] [address]. Patient reported working at [employer] and employer is aware of patients circumstances. Patient reported having 3 kids that live with their mother in [place]. Patient stated he has child support court date in February, not sure of date. Patient reported no other court dates and not on probation.  Patient declined CD-IOP   Patient reported that inpatient would provide the structure he needs  Writer will refer to [employer] and [employer]

## 2023-12-19 NOTE — ED Provider Notes (Signed)
 [place] EMERGENCY DEPARTMENT AT [facility] Provider Note   CSN: [id number] Arrival date & time: 12/18/23  2231     Patient presents with: Alcohol Intoxication   Cristian Crawford is a 38 y.o. male.   Alcohol Intoxication  Patient is a 38 year old male presenting ED today for concerns for alcohol intoxication, wheezing which she says has been ongoing x 2 days.  Reporting to have been drinking liquor tonight, uncertain of quantity.  Well-known to the department presenting with poorly controlled asthma and alcohol intoxication.  Previous medical history of asthma.  Denies fever, headache, vision changes, chest pain, shortness of breath, abdominal pain, nausea, vomiting, diarrhea, dysuria, rashes, lower leg swelling.     Prior to Admission medications  Medication Sig Start Date End Date Taking? Authorizing Provider  albuterol  (VENTOLIN  HFA) 108 (90 Base) MCG/ACT inhaler Inhale 1-2 puffs into the lungs every 6 (six) hours as needed for wheezing or shortness of breath. 12/06/22   Silver Wonda LABOR, PA  ibuprofen  (ADVIL ) 600 MG tablet Take 1 tablet (600 mg total) by mouth every 6 (six) hours as needed. 11/22/23   Horton, Charmaine FALCON, MD  multivitamin (ONE-A-DAY MEN'S) TABS tablet Take 1 tablet by mouth daily.    [provider]  naproxen sodium (ANAPROX) 220 MG tablet Take 220 mg by mouth daily as needed (pain).    [provider]  predniSONE  (DELTASONE ) 20 MG tablet Take 2 tablets (40 mg total) by mouth daily. 04/18/23   Geiple, Joshua, PA-C    Allergies: Peanut-containing drug products and Egg protein-containing drug products    Review of Systems  Updated Vital Signs BP 118/76   Pulse 97   Temp 98.7 F (37.1 C)   Resp 20   SpO2 96%   Physical Exam Vitals and nursing note reviewed.  Constitutional:      General: He is not in acute distress.    Appearance: Normal appearance. He is not ill-appearing or diaphoretic.  HENT:     Head: Normocephalic  and atraumatic.  Eyes:     General: No scleral icterus.       Right eye: No discharge.        Left eye: No discharge.     Extraocular Movements: Extraocular movements intact.     Conjunctiva/sclera: Conjunctivae normal.  Cardiovascular:     Rate and Rhythm: Normal rate and regular rhythm.     Pulses: Normal pulses.     Heart sounds: Normal heart sounds. No murmur heard.    No friction rub. No gallop.  Pulmonary:     Effort: Pulmonary effort is normal. No respiratory distress.     Breath sounds: No stridor. Wheezing present. No rhonchi or rales.  Chest:     Chest wall: No tenderness.  Abdominal:     General: Abdomen is flat. There is no distension.     Palpations: Abdomen is soft.     Tenderness: There is no abdominal tenderness. There is no right CVA tenderness, left CVA tenderness, guarding or rebound.  Musculoskeletal:        General: No swelling, deformity or signs of injury.     Cervical back: Normal range of motion. No rigidity.     Right lower leg: No edema.     Left lower leg: No edema.  Skin:    General: Skin is warm and dry.     Findings: No bruising, erythema or lesion.  Neurological:     General: No focal deficit present.  Mental Status: He is alert and oriented to person, place, and time. Mental status is at baseline.     Sensory: No sensory deficit.     Motor: No weakness.  Psychiatric:        Mood and Affect: Mood normal.     (all labs ordered are listed, but only abnormal results are displayed) Labs Reviewed  COMPREHENSIVE METABOLIC PANEL WITH GFR - Abnormal; Notable for the following components:      Result Value   Calcium 8.3 (*)    All other components within normal limits  ETHANOL - Abnormal; Notable for the following components:   Alcohol, Ethyl (B) 219 (*)    All other components within normal limits  CBC - Abnormal; Notable for the following components:   WBC 11.1 (*)    All other components within normal limits  URINE DRUG SCREEN     EKG: None  Radiology: DG Chest 2 View Result Date: 12/19/2023 EXAM: 2 VIEW(S) XRAY OF THE CHEST 12/19/2023 01:46:00 AM COMPARISON: 11/22/2023 CLINICAL HISTORY: bilateral wheezing and shortness of breath FINDINGS: LUNGS AND PLEURA: No focal pulmonary opacity. No pleural effusion. No pneumothorax. HEART AND MEDIASTINUM: No acute abnormality of the cardiac and mediastinal silhouettes. BONES AND SOFT TISSUES: No acute osseous abnormality. IMPRESSION: 1. No acute cardiopulmonary process. Electronically signed by: Pinkie Pebbles MD 12/19/2023 01:49 AM EST RP Workstation: [id number]   Procedures   Medications Ordered in the ED  albuterol  (VENTOLIN  HFA) 108 (90 Base) MCG/ACT inhaler 1-2 puff (has no administration in time range)  ipratropium-albuterol  (DUONEB) 0.5-2.5 (3) MG/3ML nebulizer solution 6 mL (6 mLs Nebulization Given 12/19/23 0201)     Medical Decision Making Amount and/or Complexity of Data Reviewed Labs: ordered. Radiology: ordered.  Risk Prescription drug management.  This patient is a 38 year old male who presents to the ED for concern of alcohol intoxication and wheezing, noting that he does not have albuterol  inhaler at home.  Noted that he has drink an unknown amount of alcohol.  On physical exam, patient is in no acute distress, afebrile, alert and orient x 4, speaking in full sentences, nontachypneic, nontachycardic.  Notable does appear intoxicated.  Does have wheezing bilaterally.  With exam otherwise unremarkable.  The patient's care presentation, provide DuoNebs.  On reevaluation, patient notably has wheezing abated, noted to feel significantly better.  Patient was monitored for extended period of time, with patient metabolizing to freedom.  Lab work and imaging was otherwise unremarkable outside of elevated ethanol of 219.  Will provide albuterol  inhaler for him to go home with.  Patient vital signs have remained stable throughout the course of patient's  time in the ED. Low suspicion for any other emergent pathology at this time. I believe this patient is safe to be discharged. Provided strict return to ER precautions. Patient expressed agreement and understanding of plan. All questions were answered.  Differential diagnoses prior to evaluation: The emergent differential diagnosis includes, but is not limited to, asthma exacerbation, pneumonia, alcohol intoxication, aspiration, malingering. This is not an exhaustive differential.   Past Medical History / Co-morbidities / Social History: Asthma, frequently seen in the emergency department for alcohol intoxication.  Additional history: Chart reviewed. Pertinent results include:   Was seen yesterday for alcohol intoxication, noting to have had some chronic chest pain reported at that time, being verbally combative and shouting obscenities.  Noted to have had eloped before EKG was done.  Lab Tests/Imaging studies: I personally interpreted labs/imaging and the pertinent results include: CBC notes a mildly  elevated white count 11.1 but otherwise unremarkable CMP notes a hypocalcemia of 8.3 bili is unremarkable Ethanol notes an elevated ethanol level of 219  Chest x-ray unremarkable.   I agree with the radiologist interpretation.    Medications: I ordered medication including DuoNebs, albuterol .  I have reviewed the patients home medicines and have made adjustments as needed.  Critical Interventions: None  Social Determinants of Health: Frequently visits to the ER  Disposition: After consideration of the diagnostic results and the patients response to treatment, I feel that the patient would benefit from discharge and treat as above.   emergency department workup does not suggest an emergent condition requiring admission or immediate intervention beyond what has been performed at this time. The plan is: Follow-up with PCP, return to ER for new or worsening symptoms, albuterol  Hailer. The  patient is safe for discharge and has been instructed to return immediately for worsening symptoms, change in symptoms or any other concerns.   Final diagnoses:  Alcoholic intoxication without complication  Wheezing    ED Discharge Orders     None          Beola Terrall RAMAN, [place] 12/19/23 9348    Raford Lenis, MD 12/19/23 [phone]

## 2023-12-19 NOTE — ED Notes (Signed)
 Pt admitted to unit for AUD. Pt oriented to uni, unit rules. Patient alert & oriented x4.  q15 checks in place. Pt denies intent to harm self or others. Denies AVH. No signs of acute distress noted. No inappropriate behaviors observed or reported. Encouraged patient to notify staff if any thoughts of harm towards self or others arise. Patient verbalizes understanding and agreement. Pt offered meal. Skin assesment complete, skin intact.

## 2023-12-19 NOTE — Progress Notes (Signed)
°   12/19/23 1025  [facility] Triage Screening (Walk-ins at [facility] only)  What Is the Reason for Your Visit/Call Today? Lucy Woolever 38y male presents to [facility] unaccompanied, looking to detox for placement at [facility]. PT shares that when he drinks he overdoes it everytime; mostly beer, sometimes liquor. PT denies SI, HI, AVH.  Have You Recently Had Any Thoughts About Hurting Yourself? No  Are You Planning to Commit Suicide/Harm Yourself At This time? No  Have you Recently Had Thoughts About Hurting Someone Sherral? No  Are You Planning To Harm Someone At This Time? No  Physical Abuse Denies  Verbal Abuse Denies  Sexual Abuse Denies  Exploitation of patient/patient's resources Denies  Self-Neglect Denies  Are you currently experiencing any auditory, visual or other hallucinations? No  Have You Used Any Alcohol or Drugs in the Past 24 Hours? Yes  What Did You Use and How Much? marijuana (1 blunt), liquor and beer (it was alot I know that, at least like 8 beers and a pint of liquor)  Do you have any current medical co-morbidities that require immediate attention?  (asthma (has rescue inhaler), all nuts (severe))  Clinician description of patient physical appearance/behavior: calm, cooperative  What Do You Feel Would Help You the Most Today? Alcohol or Drug Use Treatment;Social Support  Determination of Need Routine (7 days)  Options For Referral Facility-Based Crisis;Outpatient Therapy

## 2023-12-19 NOTE — ED Notes (Signed)
 Pt resting in bed, no acute distress noted. Respirations even and unlabored. Continue to monitor for safety.

## 2023-12-19 NOTE — ED Notes (Signed)
 Pt denies SI/HI/AVH. No reports of pain. He had no scheduled meds this shift; no prns needed. Pt did not attend AA group tonight. He was calm, cooperative, and seclusive. Pt spent majority of shift withdrawn to his room. No behavorial concerns at this time. Continue to monitor for safety.

## 2023-12-19 NOTE — Discharge Instructions (Signed)
 You were seen today for alcohol intoxication and wheezing.  I provided you with albuterol  inhaler.  Please be sure to follow-up with PCP for further evaluation of your asthma.  Return to the ER for new or worsening symptoms.

## 2023-12-19 NOTE — ED Notes (Signed)
 Patient transported to X-ray

## 2023-12-20 DIAGNOSIS — I10 Essential (primary) hypertension: Secondary | ICD-10-CM | POA: Diagnosis not present

## 2023-12-20 DIAGNOSIS — F1090 Alcohol use, unspecified, uncomplicated: Secondary | ICD-10-CM | POA: Diagnosis present

## 2023-12-20 DIAGNOSIS — F1010 Alcohol abuse, uncomplicated: Secondary | ICD-10-CM | POA: Diagnosis not present

## 2023-12-20 LAB — T4, FREE: Free T4: 0.68 ng/dL (ref 0.61–1.12)

## 2023-12-20 MED ORDER — HYDROCORTISONE 1 % EX CREA
TOPICAL_CREAM | Freq: Three times a day (TID) | CUTANEOUS | Status: DC | PRN
  Administered 2023-12-23 (×2): 1 via TOPICAL
  Filled 2023-12-20: qty 28

## 2023-12-20 MED ORDER — AMLODIPINE BESYLATE 5 MG PO TABS
5.0000 mg | ORAL_TABLET | Freq: Every day | ORAL | Status: DC
  Administered 2023-12-20 – 2023-12-23 (×4): 5 mg via ORAL
  Filled 2023-12-20: qty 7
  Filled 2023-12-20 (×4): qty 1

## 2023-12-20 NOTE — Progress Notes (Signed)
 Pt resting quietly in room.  Breathing even and unlabored. No distress noted.

## 2023-12-20 NOTE — Group Note (Signed)
 Group Topic: Healthy Self Image and Positive Change  Group Date: 12/20/2023 Start Time: 1300 End Time: 1330 Facilitators: Laneta Renea POUR, NT  Department: [facility]  Number of Participants: 2  Group Focus: coping skills Treatment Modality:  Psychoeducation Interventions utilized were problem solving Purpose: enhance coping skills and increase insight  Name: Cristian Crawford Date of Birth: 07-01-1985  MR: [id number]    Attended group, motivated to get well and keep drug free.   Patients Problems:  Patient Active Problem List   Diagnosis Date Noted   Alcohol use disorder, moderate, dependence (HCC) 12/19/2023   Alcohol use disorder 12/19/2023

## 2023-12-20 NOTE — Progress Notes (Signed)
 Per Lab T3 and T4 can be added onto existing spec in lab

## 2023-12-20 NOTE — ED Notes (Signed)
°   12/20/23 1031  Psych Admission Type (Psych Patients Only)  Admission Status Voluntary  Psychosocial Assessment  Patient Complaints Substance abuse  Eye Contact Fair  Facial Expression Flat  Affect Flat  Speech Logical/coherent  Interaction Assertive  Motor Activity Other (Comment) (Appropriate)  Appearance/Hygiene Unremarkable  Behavior Characteristics Cooperative;Appropriate to situation  Mood Pleasant  Aggressive Behavior  Effect No apparent injury  Thought Process  Coherency WDL  Content WDL  Delusions None reported or observed  Perception WDL  Hallucination None reported or observed  Judgment WDL  Confusion WDL  Danger to Self  Current suicidal ideation? Denies  Description of Suicide Plan denies  Agreement Not to Harm Self Yes  Description of Agreement verbal  Danger to Others  Danger to Others None reported or observed

## 2023-12-20 NOTE — Group Note (Signed)
 Group Topic: Relapse and Recovery  Group Date: 12/20/2023 Start Time: 0830 End Time: 0900 Facilitators: Belle Camellia SAILOR, RN  Department: [facility]  Number of Participants: 8  Group Focus: substance abuse education Treatment Modality:  Solution-Focused Therapy Interventions utilized were patient education Purpose: relapse prevention strategies  Name: Nicko Daher Date of Birth: Dec 11, 1985  MR: [id number]    Level of Participation: moderate Quality of Participation: cooperative Interactions with others: gave feedback Mood/Affect: appropriate Triggers (if applicable):  Cognition: coherent/clear Progress: Minimal Response:  Plan: follow-up needed  Patients Problems:  Patient Active Problem List   Diagnosis Date Noted   Alcohol use disorder, moderate, dependence (HCC) 12/19/2023   Alcohol use disorder 12/19/2023

## 2023-12-20 NOTE — ED Notes (Addendum)
 Pt sleeping in bed, no acute distress noted. Respirations even and unlabored. Continue to monitor for safety.

## 2023-12-20 NOTE — ED Provider Notes (Signed)
 Behavioral Health Progress Note  Date and Time: 12/20/2023 10:15 AM Name: Cristian Crawford MRN:  [id number]  HPI: Cristian Crawford is a 38 year old male who presented to [facility] behavioral health urgent care on 12/19/2023.  Patient encouraged to seek assessment by [facility].  Patient request alcohol detox.  Would like to transfer to residential substance use treatment moving forward. Psychiatric history includes diagnoses of ADHD, alcohol use disorder, THC use disorder and GAD.    Per initial admission assessment: 12/19/2023- 1142am HPI: Cristian Crawford 38 y.o., male patient presented to [facility] as a voluntary walk in unaccompanied with complaints of needing to detox from alcohol so that he can transfer to [facility] for residential treatment. Pt then admitted and transferred to Facility Based Crisis unit. Cristian Crawford, is seen face to face by this provider and chart reviewed on 12/19/2023. Pt has a past psychiatric history of ADHD, AUD, THC use and GAD. Medical hx pertinent for asthma.  Pt has multiple ER visits with alcohol intoxication. Most recent was yesterday when he had a BAL of 219. UDS positive for THC.    On evaluation Cristian Crawford reports that he left the ER this morning and went to [facility] for residential alcohol use treatment. He was referred by [facility] to come here to detox first then transfer there. He reports OK sleep and good appetite. He denies suicidal ideations, homicidal ideations and psychotic symptoms. Pt states that he was clean for about 7 months and recently relapsed a month ago on alcohol. Pt reports that his last alcohol intake was yesterday but is unsure of how much he consumed but reports drinking quite a few beers and liquor, per TTS is was 8 beers and a pint of liquor.  Pt states that he usually binge drinks alcohol a few times a week. He also endorses daily THC use. Pt denies hx of complicated withdrawals, DT's and seizures. He denies current symptoms of alcohol  withdrawal. No signs of withdrawal observed. Pt previously completed residential program at [facility] for cocaine and alcohol use last year.    Pt reports a hx of anxiety and ADHD for which he was trialed on Buspar, Strattera and Qelbree in May 2025 but did not feel much difference so he discontinued treatment. Today pt denies concerns involving his mental health and does not feel that he needs treatment for depression and anxiety. His PHQ-9 scored a 5 indicating very mild depressive symptoms which are likely situational and due to his alcohol relapse. He denies hx of self harm and suicide attempts. Pt also denies history of inpatient psychiatric treatment.    During evaluation Cristian Crawford is sitting up is assessment room, in no acute distress.  He is alert & oriented x 4, calm, cooperative and attentive for this assessment.  His mood is euthymic with congruent affect.  He has normal speech, and behavior.  Objectively there is no evidence of psychosis/mania or delusional thinking. Pt does not appear to be responding to internal or external stimuli.  Patient is able to converse coherently, goal directed thoughts, no distractibility, or pre-occupation.  He denies current  suicidal/self-harm/homicidal ideation, psychosis, and paranoia.  Patient answered assessment questions appropriately.    Patient assessment, 12/20/2023: Chart reviewed and patient discussed with attending psychiatrist, Dr. Lawrnce on 12/20/2023.  Patient is reassessed face-to-face by this nurse practitioner.  Patient is seated, no apparent distress.  He is alert and oriented, pleasant and cooperative during assessment. Patient presents with euthymic mood, congruent affect. Patient denies symptoms of withdrawal.  CIWA today measures 0. Endorses average sleep and appetite.   Concentration is good Energy level is average today. He is continuing to deny suicidal thoughts.  Denies suicidal intent and plan.  Denies having any HI.   Denies having psychotic symptoms.  Patient denies auditory or visual hallucinations, denies paranoia or delusional thinking.  Patient denies first rank symptoms. Denies delusional thoughts, and there is no current evidence of such.   Denies having side effects to current medications.    We discussed continuing medications as ordered with some adjustments as follows: Elevated blood pressure today, 159/87.  12/19/2023 elevated blood pressure 129/94.  12/18/2023 elevated blood pressure 138/90.  Patient denies history of cardiac events including MI, CVA.  No history of hypertension diagnosis, no history treatment.  Patient states my blood pressure is always high. Reviewed plan to initiate amlodipine  5 mg daily, reviewed potential side effects and offered patient opportunity to ask questions.  Patient will follow-up with primary care following discharge. Discussed decreased TSH, 0.149.  T3 and T4 labs added today.       Subjective: Patient states I would like to get away from [place].  The most clean time I have ever had was 13 months after I went to [facility] in [place] in 2023.  I called them but I cannot return because of my insurance.   Diagnosis:  Final diagnoses:  Alcohol abuse    Total Time spent with patient: 30 minutes  Past Psychiatric History: GAD, AUD, ADHD. Past Medical History: Asthma Family History: Mother: Hypertension.  Father: Hypertension. Family Psychiatric  History: None reported Social History: Patient resides in [place] with his brother.  He is currently employed.  He denies access to weapons.  Endorses frequent alcohol and THC use.  Additional Social History:                         Sleep: Good  Appetite:  Good  Current Medications:  Current Facility-Administered Medications  Medication Dose Route Frequency Provider Last Rate Last Admin   acetaminophen  (TYLENOL ) tablet 650 mg  650 mg Oral Q6H PRN Brent, Amanda C, NP        albuterol  (VENTOLIN  HFA) 108 (90 Base) MCG/ACT inhaler 1-2 puff  1-2 puff Inhalation Q4H PRN Brent, Amanda C, NP   2 puff at 12/20/23 0905   alum & mag hydroxide-simeth (MAALOX/MYLANTA) 200-200-20 MG/5ML suspension 30 mL  30 mL Oral Q4H PRN Brent, Amanda C, NP       haloperidol  (HALDOL ) tablet 5 mg  5 mg Oral TID PRN Brent, Amanda C, NP       And   diphenhydrAMINE  (BENADRYL ) capsule 50 mg  50 mg Oral TID PRN Brent, Amanda C, NP       haloperidol  lactate (HALDOL ) injection 5 mg  5 mg Intramuscular TID PRN Brent, Amanda C, NP       And   diphenhydrAMINE  (BENADRYL ) injection 50 mg  50 mg Intramuscular TID PRN Brent, Amanda C, NP       And   LORazepam  (ATIVAN ) injection 2 mg  2 mg Intramuscular TID PRN Brent, Amanda C, NP       haloperidol  lactate (HALDOL ) injection 10 mg  10 mg Intramuscular TID PRN Brent, Amanda C, NP       And   diphenhydrAMINE  (BENADRYL ) injection 50 mg  50 mg Intramuscular TID PRN Brent, Amanda C, NP       And   LORazepam  (ATIVAN ) injection 2 mg  2 mg Intramuscular TID PRN Brent, Amanda C, NP       hydrOXYzine  (ATARAX ) tablet 25 mg  25 mg Oral Q6H PRN Brent, Amanda C, NP       loperamide  (IMODIUM ) capsule 2-4 mg  2-4 mg Oral PRN Brent, Amanda C, NP       LORazepam  (ATIVAN ) tablet 1 mg  1 mg Oral Q6H PRN Brent, Amanda C, NP       magnesium  hydroxide (MILK OF MAGNESIA) suspension 30 mL  30 mL Oral Daily PRN Brent, Amanda C, NP       multivitamin with minerals tablet 1 tablet  1 tablet Oral Daily Brent, Amanda C, NP   1 tablet at 12/20/23 9093   naltrexone  (DEPADE) tablet 50 mg  50 mg Oral Daily Brent, Amanda C, NP   50 mg at 12/20/23 9093   ondansetron  (ZOFRAN -ODT) disintegrating tablet 4 mg  4 mg Oral Q6H PRN Brent, Amanda C, NP       thiamine  (VITAMIN B1) tablet 100 mg  100 mg Oral Daily Brent, Amanda C, NP   100 mg at 12/20/23 9093   traZODone  (DESYREL ) tablet 50 mg  50 mg Oral QHS PRN Brent, Amanda C, NP       Current Outpatient Medications  Medication Sig Dispense  Refill   Multiple Vitamin (MULTIVITAMIN WITH MINERALS) TABS tablet Take 1 tablet by mouth daily.      Labs  Lab Results:  Admission on 12/19/2023, Discharged on 12/19/2023  Component Date Value Ref Range Status   TSH 12/19/2023 0.149 (L)  0.350 - 4.500 uIU/mL Final   Comment: Performed by a 3rd Generation assay with a functional sensitivity of <=0.01 uIU/mL. Performed at [facility], [address], [place], [place] [address]    Alcohol, Ethyl (B) 12/19/2023 <15  <15 mg/dL Final   Comment: (NOTE) For medical purposes only. Performed at [facility], [address], [place], [place] [address]    Magnesium  12/19/2023 2.2  1.7 - 2.4 mg/dL Final   Performed at [facility], [address], [place], [place] [address]   Cholesterol 12/19/2023 130  0 - 200 mg/dL Final   Triglycerides 87/87/7974 43  <150 mg/dL Final   HDL 87/87/7974 66  >40 mg/dL Final   Total CHOL/HDL Ratio 12/19/2023 2.0  RATIO Final   VLDL 12/19/2023 9  0 - 40 mg/dL Final   LDL Cholesterol 12/19/2023 55  0 - 99 mg/dL Final   Comment:        Total Cholesterol/HDL:CHD Risk Coronary Heart Disease Risk Table                     Men   Women  1/2 Average Risk   3.4   3.3  Average Risk       5.0   4.4  2 X Average Risk   9.6   7.1  3 X Average Risk  23.4   11.0        Use the calculated Patient Ratio above and the CHD Risk Table to determine the patient's CHD Risk.        ATP III CLASSIFICATION (LDL):  <100     mg/dL   Optimal  899-870  mg/dL   Near or Above                    Optimal  130-159  mg/dL   Borderline  839-810  mg/dL   High  >  190     mg/dL   Very High Performed at [facility], [address] [place], [place] [address]    POC Amphetamine UR 12/19/2023 None Detected  NONE DETECTED (Cut Off Level 1000 ng/mL) Final   POC Secobarbital (BAR) 12/19/2023 None Detected  NONE DETECTED (Cut Off Level 300 ng/mL) Final   POC Buprenorphine (BUP) 12/19/2023 None Detected  NONE DETECTED  (Cut Off Level 10 ng/mL) Final   POC Oxazepam (BZO) 12/19/2023 None Detected  NONE DETECTED (Cut Off Level 300 ng/mL) Final   POC Cocaine UR 12/19/2023 None Detected  NONE DETECTED (Cut Off Level 300 ng/mL) Final   POC Methamphetamine UR 12/19/2023 None Detected  NONE DETECTED (Cut Off Level 1000 ng/mL) Final   POC Morphine 12/19/2023 None Detected  NONE DETECTED (Cut Off Level 300 ng/mL) Final   POC Methadone UR 12/19/2023 None Detected  NONE DETECTED (Cut Off Level 300 ng/mL) Final   POC Oxycodone UR 12/19/2023 None Detected  NONE DETECTED (Cut Off Level 100 ng/mL) Final   POC Marijuana UR 12/19/2023 Positive (A)  NONE DETECTED (Cut Off Level 50 ng/mL) Final  Admission on 12/18/2023, Discharged on 12/19/2023  Component Date Value Ref Range Status   Sodium 12/18/2023 145  135 - 145 mmol/L Final   Potassium 12/18/2023 3.8  3.5 - 5.1 mmol/L Final   Chloride 12/18/2023 107  98 - 111 mmol/L Final   CO2 12/18/2023 23  22 - 32 mmol/L Final   Glucose, Bld 12/18/2023 82  70 - 99 mg/dL Final   Glucose reference range applies only to samples taken after fasting for at least 8 hours.   BUN 12/18/2023 11  6 - 20 mg/dL Final   Creatinine, Ser 12/18/2023 0.94  0.61 - 1.24 mg/dL Final   Calcium 87/88/7974 8.3 (L)  8.9 - 10.3 mg/dL Final   Total Protein 87/88/7974 7.6  6.5 - 8.1 g/dL Final   Albumin 87/88/7974 4.5  3.5 - 5.0 g/dL Final   AST 87/88/7974 21  15 - 41 U/L Final   ALT 12/18/2023 7  0 - 44 U/L Final   Alkaline Phosphatase 12/18/2023 101  38 - 126 U/L Final   Total Bilirubin 12/18/2023 <0.2  0.0 - 1.2 mg/dL Final   GFR, Estimated 12/18/2023 >60  >60 mL/min Final   Comment: (NOTE) Calculated using the CKD-EPI Creatinine Equation (2021)    Anion gap 12/18/2023 14  5 - 15 Final   Performed at [facility], [address] [place], [place] [address]   Alcohol, Ethyl (B) 12/18/2023 219 (H)  <15 mg/dL Final   Comment: (NOTE) For medical purposes only. Performed at [facility], [address] [place], [place] [address]    WBC 12/18/2023 11.1 (H)  4.0 - 10.5 K/uL Final   RBC 12/18/2023 4.65  4.22 - 5.81 MIL/uL Final   Hemoglobin 12/18/2023 14.5  13.0 - 17.0 g/dL Final   HCT 87/88/7974 43.0  39.0 - 52.0 % Final   MCV 12/18/2023 92.5  80.0 - 100.0 fL Final   MCH 12/18/2023 31.2  26.0 - 34.0 pg Final   MCHC 12/18/2023 33.7  30.0 - 36.0 g/dL Final   RDW 87/88/7974 13.1  11.5 - 15.5 % Final   Platelets 12/18/2023 385  150 - 400 K/uL Final   nRBC 12/18/2023 0.0  0.0 - 0.2 % Final   Performed at [facility], [address] [place], [place] [address]    Blood Alcohol level:  Lab Results  Component Value Date   ETH <15 12/19/2023   ETH 219 (H) 12/18/2023    Metabolic Disorder Labs: No results found for: HGBA1C, MPG No results found for: PROLACTIN Lab Results  Component Value Date   CHOL 130 12/19/2023   TRIG 43 12/19/2023   HDL 66 12/19/2023   CHOLHDL 2.0 12/19/2023   VLDL 9 12/19/2023   LDLCALC 55 12/19/2023    Therapeutic Lab Levels: No results found for: LITHIUM No results found for: VALPROATE No results found for: CBMZ  Physical Findings   PHQ2-9    Flowsheet Row ED from 12/19/2023 in [facility] Most recent reading at 12/19/2023 12:00 PM ED from 12/19/2023 in [facility] Most recent reading at 12/19/2023 11:59 AM  PHQ-2 Total Score 2 3  PHQ-9 Total Score 5 6   Flowsheet Row ED from 12/19/2023 in [facility] Most recent reading at 12/19/2023 12:45 PM ED from 12/19/2023 in [facility] Most recent reading at 12/19/2023 10:29 AM ED from 12/18/2023 in [facility] Emergency Department at [facility] Most recent reading at 12/18/2023 10:45 PM  C-SSRS RISK CATEGORY No Risk No Risk No Risk     Musculoskeletal  Strength & Muscle Tone: within normal limits Gait &  Station: normal Patient leans: N/A  Psychiatric Specialty Exam  Presentation  General Appearance:  Fairly Groomed  Eye Contact: Good  Speech: Clear and Coherent; Normal Rate  Speech Volume: Normal  Handedness: Right   Mood and Affect  Mood: Euthymic  Affect: Appropriate; Congruent   Thought Process  Thought Processes: Coherent; Linear; Goal Directed  Descriptions of Associations:Intact  Orientation:Full (Time, Place and Person)  Thought Content:Logical; WDL  Diagnosis of Schizophrenia or Schizoaffective disorder in past: No    Hallucinations:Hallucinations: None  Ideas of Reference:None  Suicidal Thoughts:Suicidal Thoughts: No  Homicidal Thoughts:Homicidal Thoughts: No   Sensorium  Memory: Immediate Good; Recent Good  Judgment: Good  Insight: Good   Executive Functions  Concentration: Good  Attention Span: Good  Recall: Good  Fund of Knowledge: Good  Language: Good   Psychomotor Activity  Psychomotor Activity: Psychomotor Activity: Normal   Assets  Assets: Communication Skills; Crawford for Improvement; Financial Resources/Insurance; Resilience; Social Support; Physical Health   Sleep  Sleep: Sleep: Good  Estimated Sleeping Duration (Last 24 Hours): 16.25 hours  Nutritional Assessment (For OBS and FBC admissions only) Has the patient had a weight loss or gain of 10 pounds or more in the last 3 months?: No Has the patient had a decrease in food intake/or appetite?: No Does the patient have dental problems?: No Does the patient have eating habits or behaviors that may be indicators of an eating disorder including binging or inducing vomiting?: No Has the patient recently lost weight without trying?: 0 Has the patient been eating poorly because of a decreased appetite?: 0 Malnutrition Screening Tool Score: 0    Physical Exam  Physical Exam Vitals and nursing note reviewed.  Constitutional:      Appearance: Normal  appearance. He is well-developed.  HENT:     Head: Normocephalic and atraumatic.     Nose: Nose normal.  Cardiovascular:     Rate and Rhythm: Normal rate.  Pulmonary:     Effort: Pulmonary effort is normal.  Musculoskeletal:        General: Normal range of motion.     Cervical back: Normal range of motion.  Skin:    General: Skin  is warm and dry.  Neurological:     Mental Status: He is alert and oriented to person, place, and time.  Psychiatric:        Attention and Perception: Attention and perception normal.        Mood and Affect: Mood and affect normal.        Speech: Speech normal.        Behavior: Behavior normal. Behavior is cooperative.        Thought Content: Thought content normal.        Cognition and Memory: Cognition and memory normal.        Judgment: Judgment normal.    Review of Systems  Constitutional: Negative.   HENT: Negative.    Eyes: Negative.   Respiratory: Negative.    Cardiovascular: Negative.   Gastrointestinal: Negative.   Genitourinary: Negative.   Musculoskeletal: Negative.   Skin: Negative.   Neurological: Negative.   Psychiatric/Behavioral:  Positive for substance abuse.    Blood pressure (!) 159/87, pulse 75, temperature 97.9 F (36.6 C), resp. rate 17, SpO2 100%. There is no height or weight on file to calculate BMI.  Treatment Plan Summary: Daily contact with patient to assess and evaluate symptoms and progress in treatment  Labs Reviewed: New lab orders placed, T3, T4.  EKG from 12/19/2023 with NSR QTc.  Medication management: Start amlodipine  5 mg daily/elevated BP  Current medications: -Acetaminophen  650 mg every 6 hours, as needed/mild pain -Albuterol  Ventolin  HFA 108 mcg 1 to 2 puff inhalation every 4 hours as needed wheezing or shortness of breath -Maalox 30 mL oral every 4 hours, as needed/digestion -Hydroxyzine  25 mg every 6 hours as needed/anxiety -Loperamide  2 to 4 mg oral as needed/diarrhea or loose  stools -Lorazepam  1 mg every 6 hours as needed/CIWA greater than 10 -Magnesium  hydroxide 30 mL daily as needed/mild constipation -Multivitamin 1 tablet daily -Naltrexone  50 mg oral daily -Thiamine  100 mg oral daily -Trazodone  50 mg nightly, as needed/sleep -NicoDerm 14 mg transdermal patch daily/nicotine withdrawal  Agitation protocol: MILD -Haloperidol  5 mg 3 times daily as needed mild agitation  -Diphenhydramine  50 mg p.o. 3 times daily as needed mild agitation  MODERATE -Haloperidol  5 mg IM 3 times daily as needed/moderate agitation -Diphenhydramine  50 mg IM 3 times daily as needed/moderate agitation -Lorazepam  2 mg IM 3 times daily as needed/moderate agitation  SEVERE -Haloperidol  10 mg IM 3 times daily as needed severe agitation -Diphenhydramine  50 mg IM 3 times daily as needed/severe agitation -Lorazepam  2 mg IM 3 times daily as needed/severe agitation    Discharge Planning: Disposition/case management to assist with discharge planning and identification of follow-up needs including residential or outpatient substance use treatment options as needed, prior to discharge Discharge Concerns: Medication compliance and effectiveness. PCP resources. Discharge Goals: Return home with outpatient referrals for mental health follow-up including medication management/psychotherapy and substance use treatment follow-up resources as appropriate  Ellouise LITTIE Dawn, FNP 12/20/2023 10:15 AM

## 2023-12-20 NOTE — Group Note (Signed)
 Group Topic: Recovery Basics  Group Date: 12/20/2023 Start Time: 2000 End Time: 2030 Facilitators: Verdon Jacqualyn BRAVO, NT  Department: [facility]  Number of Participants: 9  Group Focus: chemical dependency issues Treatment Modality:  Individual Therapy Interventions utilized were group exercise Purpose: relapse prevention strategies  Name: Cristian Crawford Date of Birth: 08-Feb-1985  MR: [id number]    Level of Participation: active Quality of Participation: cooperative Interactions with others: gave feedback Mood/Affect: appropriate Triggers (if applicable): n/a Cognition: coherent/clear Progress: Moderate Response: n/a Plan: follow-up needed  Patients Problems:  Patient Active Problem List   Diagnosis Date Noted   Alcohol use disorder, moderate, dependence (HCC) 12/19/2023   Alcohol use disorder 12/19/2023

## 2023-12-20 NOTE — ED Notes (Signed)
 Pt resting in bed, no acute distress noted. Respirations even and unlabored. Continue to monitor for safety.

## 2023-12-20 NOTE — ED Notes (Addendum)
 Pt denies SI/HI/AVH. No reports of pain. No scheduled meds this shift; prn albuterol , atarax , and trazodone  given. Pt attended AA group this evening. He was calm, cooperative, and pleasant. No behavioral concerns at this time. Currently sleeping in bed, no acute distress noted. Respirations even and unlabored. Continue to monitor for safety.

## 2023-12-21 NOTE — ED Notes (Signed)
 Pt sitting in dayroom interacting with peers and watching TV. No acute distress noted. Continue to monitor for safety.

## 2023-12-21 NOTE — Group Note (Signed)
 Group Topic: Recovery Basics  Group Date: 12/21/2023 Start Time: 1300 End Time: 1345 Facilitators: Alyse Leilani LABOR, NT  Department: [facility]  Number of Participants: 8  Group Focus: relapse prevention, self-awareness, self-esteem, and social skills Treatment Modality:  Patient-Centered Therapy Interventions utilized were clarification, patient education, and problem solving Purpose: enhance coping skills, express feelings, increase insight, regain self-worth, and reinforce self-care  Name: Cristian Crawford Date of Birth: 12-17-1985  MR: [id number]    Level of Participation: active Quality of Participation: cooperative, engaged, and motivated Interactions with others: gave feedback Mood/Affect: positive Triggers (if applicable): listened Cognition: goal directed and insightful Progress: Gaining insight Response: none Plan: patient will be encouraged to keep coming to group.  Patients Problems:  Patient Active Problem List   Diagnosis Date Noted   Alcohol use disorder, moderate, dependence (HCC) 12/19/2023   Alcohol use disorder 12/19/2023

## 2023-12-21 NOTE — ED Notes (Signed)
 Pt denies SI/HI/AVH. No reports of pain. No scheduled meds this shift. Prn albuterol , atarax , and hydrocortisone  cream given. He was calm, cooperative, pleasant, and silly. No behavioral concerns at this time. Currently sleeping in bed, no acute distress noted. Respirations even and unlabored. Continue to monitor for safety.

## 2023-12-21 NOTE — ED Provider Notes (Signed)
 Behavioral Health Progress Note  Date and Time: 12/21/2023 2:55 PM Name: Cristian Crawford MRN:  [id number]  HPI: Cristian Crawford is a 38 year old male who presented to [facility] behavioral health urgent care on 12/19/2023.  Patient encouraged to seek assessment by [facility].  Patient request alcohol detox.  Would like to transfer to residential substance use treatment moving forward. Psychiatric history includes diagnoses of ADHD, alcohol use disorder, THC use disorder and GAD.     Per initial admission assessment: 12/19/2023- 1142am HPI: Cristian Crawford 38 y.o., male patient presented to [facility] as a voluntary walk in unaccompanied with complaints of needing to detox from alcohol so that he can transfer to [facility] for residential treatment. Pt then admitted and transferred to Facility Based Crisis unit. Cristian Crawford, is seen face to face by this provider and chart reviewed on 12/19/2023. Pt has a past psychiatric history of ADHD, AUD, THC use and GAD. Medical hx pertinent for asthma.  Pt has multiple ER visits with alcohol intoxication. Most recent was yesterday when he had a BAL of 219. UDS positive for THC.    On evaluation Cristian Crawford reports that he left the ER this morning and went to [facility] for residential alcohol use treatment. He was referred by [facility] to come here to detox first then transfer there. He reports OK sleep and good appetite. He denies suicidal ideations, homicidal ideations and psychotic symptoms. Pt states that he was clean for about 7 months and recently relapsed a month ago on alcohol. Pt reports that his last alcohol intake was yesterday but is unsure of how much he consumed but reports drinking quite a few beers and liquor, per TTS is was 8 beers and a pint of liquor.  Pt states that he usually binge drinks alcohol a few times a week. He also endorses daily THC use. Pt denies hx of complicated withdrawals, DT's and seizures. He denies current symptoms of alcohol  withdrawal. No signs of withdrawal observed. Pt previously completed residential program at [facility] for cocaine and alcohol use last year.    Pt reports a hx of anxiety and ADHD for which he was trialed on Buspar, Strattera and Qelbree in May 2025 but did not feel much difference so he discontinued treatment. Today pt denies concerns involving his mental health and does not feel that he needs treatment for depression and anxiety. His PHQ-9 scored a 5 indicating very mild depressive symptoms which are likely situational and due to his alcohol relapse. He denies hx of self harm and suicide attempts. Pt also denies history of inpatient psychiatric treatment.    During evaluation Cristian Crawford is sitting up is assessment room, in no acute distress.  He is alert & oriented x 4, calm, cooperative and attentive for this assessment.  His mood is euthymic with congruent affect.  He has normal speech, and behavior.  Objectively there is no evidence of psychosis/mania or delusional thinking. Pt does not appear to be responding to internal or external stimuli.  Patient is able to converse coherently, goal directed thoughts, no distractibility, or pre-occupation.  He denies current  suicidal/self-harm/homicidal ideation, psychosis, and paranoia.  Patient answered assessment questions appropriately.   Patient reassessment 12/21/2023.  Chart reviewed and patient discussed with attending psychiatrist, Dr. Lawrnce. Cristian Crawford is reassessed by this nurse practitioner face-to-face.  [name], actively engaged with peers.  Very appropriate with staff and peers.  Patient is actively engaged in groups, attends meetings in common area.  Patient is compliant with medications.  Patient is alert and oriented, pleasant and cooperative during assessment.  He presents with euthymic mood, congruent affect. Patient endorses average sleep and appetite.  Trazodone  50 mg administered last night  reports the medicine really help me to sleep and I did not keep waking up. Patient denies symptoms of withdrawal at this time, CIWA today measures 2.  Endorses headache, pain 3/10.  Patient aware that he can use acetaminophen  as needed. Patient reports energy level average and improving.  Patient continues to deny SI/HI/AVH.  There is no evidence of delusional thought content no indication that patient is responding to internal stimuli.  Cristian Crawford remains committed to his sobriety.  Would like to seek residential substance use treatment moving forward.  Patient offered support and encouragement.  He verbalizes understanding of treatment plan.     Diagnosis:  Final diagnoses:  Alcohol abuse  Hypertension, unspecified type    Total Time spent with patient: 20 minutes  Past Psychiatric History: GAD, AUD, ADHD. Past Medical History: Asthma Family History: Mother: Hypertension.  Father: Hypertension. Family Psychiatric  History: None reported Social History: Patient resides in [place] with his brother.  He is currently employed.  He denies access to weapons.  Endorses frequent alcohol and THC use.    Additional Social History:       Sleep: Good  Appetite:  Good  Current Medications:  Current Facility-Administered Medications  Medication Dose Route Frequency Provider Last Rate Last Admin   acetaminophen  (TYLENOL ) tablet 650 mg  650 mg Oral Q6H PRN Brent, Amanda C, NP       albuterol  (VENTOLIN  HFA) 108 (90 Base) MCG/ACT inhaler 1-2 puff  1-2 puff Inhalation Q4H PRN Brent, Amanda C, NP   2 puff at 12/20/23 2135   alum & mag hydroxide-simeth (MAALOX/MYLANTA) 200-200-20 MG/5ML suspension 30 mL  30 mL Oral Q4H PRN Brent, Amanda C, NP       amLODipine  (NORVASC ) tablet 5 mg  5 mg Oral Daily Dasie Ellouise CROME, FNP   5 mg at 12/21/23 9075   haloperidol  (HALDOL ) tablet 5 mg  5 mg Oral TID PRN Brent, Amanda C, NP       And   diphenhydrAMINE  (BENADRYL ) capsule 50 mg  50 mg Oral TID PRN  Brent, Amanda C, NP       haloperidol  lactate (HALDOL ) injection 5 mg  5 mg Intramuscular TID PRN Brent, Amanda C, NP       And   diphenhydrAMINE  (BENADRYL ) injection 50 mg  50 mg Intramuscular TID PRN Brent, Amanda C, NP       And   LORazepam  (ATIVAN ) injection 2 mg  2 mg Intramuscular TID PRN Brent, Amanda C, NP       haloperidol  lactate (HALDOL ) injection 10 mg  10 mg Intramuscular TID PRN Thresa Alan BROCKS, NP       And   diphenhydrAMINE  (BENADRYL ) injection 50 mg  50 mg Intramuscular TID PRN Brent, Amanda C, NP       And   LORazepam  (ATIVAN ) injection 2 mg  2 mg Intramuscular TID PRN Brent, Amanda C, NP       hydrocortisone  cream 1 %   Topical TID PRN Bobbitt, Shalon E, NP   Given at 12/20/23 2203   hydrOXYzine  (ATARAX ) tablet 25 mg  25 mg Oral Q6H PRN Brent, Amanda C, NP   25 mg at 12/20/23 2134   loperamide  (IMODIUM ) capsule 2-4 mg  2-4 mg Oral PRN Brent, Amanda C, NP  LORazepam  (ATIVAN ) tablet 1 mg  1 mg Oral Q6H PRN Brent, Amanda C, NP       magnesium  hydroxide (MILK OF MAGNESIA) suspension 30 mL  30 mL Oral Daily PRN Brent, Amanda C, NP       multivitamin with minerals tablet 1 tablet  1 tablet Oral Daily Brent, Amanda C, NP   1 tablet at 12/21/23 9075   naltrexone  (DEPADE) tablet 50 mg  50 mg Oral Daily Brent, Amanda C, NP   50 mg at 12/21/23 9075   ondansetron  (ZOFRAN -ODT) disintegrating tablet 4 mg  4 mg Oral Q6H PRN Brent, Amanda C, NP       thiamine  (VITAMIN B1) tablet 100 mg  100 mg Oral Daily Brent, Amanda C, NP   100 mg at 12/21/23 0925   traZODone  (DESYREL ) tablet 50 mg  50 mg Oral QHS PRN Brent, Amanda C, NP   50 mg at 12/20/23 2134   Current Outpatient Medications  Medication Sig Dispense Refill   Multiple Vitamin (MULTIVITAMIN WITH MINERALS) TABS tablet Take 1 tablet by mouth daily.      Labs  Lab Results:  Admission on 12/19/2023  Component Date Value Ref Range Status   Free T4 12/20/2023 0.68  0.61 - 1.12 ng/dL Final   Comment: (NOTE) Biotin ingestion may  interfere with free T4 tests. If the results are inconsistent with the TSH level, previous test results, or the clinical presentation, then consider biotin interference. If needed, order repeat testing after stopping biotin. Performed at [facility], [address], [place], [place] [address]   Admission on 12/19/2023, Discharged on 12/19/2023  Component Date Value Ref Range Status   TSH 12/19/2023 0.149 (L)  0.350 - 4.500 uIU/mL Final   Comment: Performed by a 3rd Generation assay with a functional sensitivity of <=0.01 uIU/mL. Performed at [facility], [address] [place], [place] [address]    Alcohol, Ethyl (B) 12/19/2023 <15  <15 mg/dL Final   Comment: (NOTE) For medical purposes only. Performed at [facility], [address] [place], [place] [address]    Magnesium  12/19/2023 2.2  1.7 - 2.4 mg/dL Final   Performed at [facility], [address], [place], [place] [address]   Cholesterol 12/19/2023 130  0 - 200 mg/dL Final   Triglycerides 87/87/7974 43  <150 mg/dL Final   HDL 87/87/7974 66  >40 mg/dL Final   Total CHOL/HDL Ratio 12/19/2023 2.0  RATIO Final   VLDL 12/19/2023 9  0 - 40 mg/dL Final   LDL Cholesterol 12/19/2023 55  0 - 99 mg/dL Final   Comment:        Total Cholesterol/HDL:CHD Risk Coronary Heart Disease Risk Table                     Men   Women  1/2 Average Risk   3.4   3.3  Average Risk       5.0   4.4  2 X Average Risk   9.6   7.1  3 X Average Risk  23.4   11.0        Use the calculated Patient Ratio above and the CHD Risk Table to determine the patient's CHD Risk.        ATP III CLASSIFICATION (LDL):  <100     mg/dL   Optimal  899-870  mg/dL   Near or Above  Optimal  130-159  mg/dL   Borderline  839-810  mg/dL   High  >809     mg/dL   Very High Performed at [facility], [address], [place], [place] [address]    POC Amphetamine UR 12/19/2023 None Detected  NONE DETECTED (Cut Off Level  1000 ng/mL) Final   POC Secobarbital (BAR) 12/19/2023 None Detected  NONE DETECTED (Cut Off Level 300 ng/mL) Final   POC Buprenorphine (BUP) 12/19/2023 None Detected  NONE DETECTED (Cut Off Level 10 ng/mL) Final   POC Oxazepam (BZO) 12/19/2023 None Detected  NONE DETECTED (Cut Off Level 300 ng/mL) Final   POC Cocaine UR 12/19/2023 None Detected  NONE DETECTED (Cut Off Level 300 ng/mL) Final   POC Methamphetamine UR 12/19/2023 None Detected  NONE DETECTED (Cut Off Level 1000 ng/mL) Final   POC Morphine 12/19/2023 None Detected  NONE DETECTED (Cut Off Level 300 ng/mL) Final   POC Methadone UR 12/19/2023 None Detected  NONE DETECTED (Cut Off Level 300 ng/mL) Final   POC Oxycodone UR 12/19/2023 None Detected  NONE DETECTED (Cut Off Level 100 ng/mL) Final   POC Marijuana UR 12/19/2023 Positive (A)  NONE DETECTED (Cut Off Level 50 ng/mL) Final  Admission on 12/18/2023, Discharged on 12/19/2023  Component Date Value Ref Range Status   Sodium 12/18/2023 145  135 - 145 mmol/L Final   Potassium 12/18/2023 3.8  3.5 - 5.1 mmol/L Final   Chloride 12/18/2023 107  98 - 111 mmol/L Final   CO2 12/18/2023 23  22 - 32 mmol/L Final   Glucose, Bld 12/18/2023 82  70 - 99 mg/dL Final   Glucose reference range applies only to samples taken after fasting for at least 8 hours.   BUN 12/18/2023 11  6 - 20 mg/dL Final   Creatinine, Ser 12/18/2023 0.94  0.61 - 1.24 mg/dL Final   Calcium 87/88/7974 8.3 (L)  8.9 - 10.3 mg/dL Final   Total Protein 87/88/7974 7.6  6.5 - 8.1 g/dL Final   Albumin 87/88/7974 4.5  3.5 - 5.0 g/dL Final   AST 87/88/7974 21  15 - 41 U/L Final   ALT 12/18/2023 7  0 - 44 U/L Final   Alkaline Phosphatase 12/18/2023 101  38 - 126 U/L Final   Total Bilirubin 12/18/2023 <0.2  0.0 - 1.2 mg/dL Final   GFR, Estimated 12/18/2023 >60  >60 mL/min Final   Comment: (NOTE) Calculated using the CKD-EPI Creatinine Equation (2021)    Anion gap 12/18/2023 14  5 - 15 Final   Performed at [facility], [address] [place], [place] [address]   Alcohol, Ethyl (B) 12/18/2023 219 (H)  <15 mg/dL Final   Comment: (NOTE) For medical purposes only. Performed at [facility], [address] [place], [place] [address]    WBC 12/18/2023 11.1 (H)  4.0 - 10.5 K/uL Final   RBC 12/18/2023 4.65  4.22 - 5.81 MIL/uL Final   Hemoglobin 12/18/2023 14.5  13.0 - 17.0 g/dL Final   HCT 87/88/7974 43.0  39.0 - 52.0 % Final   MCV 12/18/2023 92.5  80.0 - 100.0 fL Final   MCH 12/18/2023 31.2  26.0 - 34.0 pg Final   MCHC 12/18/2023 33.7  30.0 - 36.0 g/dL Final   RDW 87/88/7974 13.1  11.5 - 15.5 % Final   Platelets 12/18/2023 385  150 - 400 K/uL Final   nRBC 12/18/2023 0.0  0.0 - 0.2 % Final   Performed at [facility], [address], [place], [place] [address]    Blood Alcohol level:  Lab Results  Component Value Date   [facility] <15 12/19/2023   ETH 219 (H) 12/18/2023    Metabolic Disorder Labs: No results found for: HGBA1C, MPG No results found for: PROLACTIN Lab Results  Component Value Date   CHOL 130 12/19/2023   TRIG 43 12/19/2023   HDL 66 12/19/2023   CHOLHDL 2.0 12/19/2023   VLDL 9 12/19/2023   LDLCALC 55 12/19/2023    Therapeutic Lab Levels: No results found for: LITHIUM No results found for: VALPROATE No results found for: CBMZ  Physical Findings   PHQ2-9    Flowsheet Row ED from 12/19/2023 in [facility] Most recent reading at 12/19/2023 12:00 PM ED from 12/19/2023 in [facility] Most recent reading at 12/19/2023 11:59 AM  PHQ-2 Total Score 2 3  PHQ-9 Total Score 5 6   Flowsheet Row ED from 12/19/2023 in [facility] Most recent reading at 12/19/2023 12:45 PM ED from 12/19/2023 in [facility] Most recent reading at 12/19/2023 10:29 AM ED from 12/18/2023 in [facility] Emergency Department at [facility] Most recent reading at 12/18/2023 10:45 PM  C-SSRS RISK CATEGORY No Risk No Risk No Risk     Musculoskeletal  Strength & Muscle Tone: within normal limits Gait & Station: normal Patient leans: N/A  Psychiatric Specialty Exam  Presentation  General Appearance:  Appropriate for Environment; Casual; Fairly Groomed  Eye Contact: Good  Speech: Clear and Coherent; Normal Rate  Speech Volume: Normal  Handedness: Right   Mood and Affect  Mood: Euthymic  Affect: Appropriate; Congruent   Thought Process  Thought Processes: Coherent; Goal Directed; Linear  Descriptions of Associations:Intact  Orientation:Full (Time, Place and Person)  Thought Content:Logical; WDL  Diagnosis of Schizophrenia or Schizoaffective disorder in past: No    Hallucinations:Hallucinations: None  Ideas of Reference:None  Suicidal Thoughts:Suicidal Thoughts: No  Homicidal Thoughts:Homicidal Thoughts: No   Sensorium  Memory: Immediate Good; Recent Good  Judgment: Good  Insight: Good   Executive Functions  Concentration: Good  Attention Span: Good  Recall: Good  Fund of Knowledge: Good  Language: Good   Psychomotor Activity  Psychomotor Activity: Psychomotor Activity: Normal   Assets  Assets: Communication Skills; Desire for Improvement; Financial Resources/Insurance; Resilience; Social Support; Physical Health   Sleep  Sleep: Sleep: Good  Estimated Sleeping Duration (Last 24 Hours): 10.00-12.25 hours  No data recorded  Physical Exam  Physical Exam Vitals and nursing note reviewed.  Constitutional:      General: He is not in acute distress.    Appearance: Normal appearance. He is well-developed.  HENT:     Head: Normocephalic and atraumatic.     Nose: Nose normal.  Eyes:     Conjunctiva/sclera: Conjunctivae normal.  Cardiovascular:     Rate and Rhythm: Normal rate and regular rhythm.     Heart sounds: No murmur heard. Pulmonary:      Effort: Pulmonary effort is normal. No respiratory distress.     Breath sounds: Normal breath sounds.  Abdominal:     Palpations: Abdomen is soft.     Tenderness: There is no abdominal tenderness.  Musculoskeletal:        General: No swelling. Normal range of motion.     Cervical back: Normal range of motion and neck supple.  Skin:    General: Skin is warm and dry.  Capillary Refill: Capillary refill takes less than 2 seconds.  Neurological:     Mental Status: He is alert.  Psychiatric:        Attention and Perception: Attention and perception normal.        Mood and Affect: Mood and affect normal.        Speech: Speech normal.        Behavior: Behavior normal. Behavior is cooperative.        Thought Content: Thought content normal.        Cognition and Memory: Cognition and memory normal.        Judgment: Judgment normal.    Review of Systems  Constitutional: Negative.   HENT: Negative.    Eyes: Negative.   Respiratory: Negative.    Cardiovascular: Negative.   Gastrointestinal: Negative.   Genitourinary: Negative.   Musculoskeletal: Negative.   Skin: Negative.   Neurological:  Positive for headaches.  Psychiatric/Behavioral: Negative.     Blood pressure 115/86, pulse 75, temperature 98.6 F (37 C), temperature source Oral, resp. rate 20, SpO2 100%. There is no height or weight on file to calculate BMI.  Treatment Plan Summary: Daily contact with patient to assess and evaluate symptoms and progress in treatment  Labs Reviewed:  12/20/2023: T4,Free: WNL 0.68. 12/12/205: TSH: decreased 0.149.  TSH decreased likely related to chronic alcohol use. Low TSH can negatively impact mood.   Patient will follow with PCP, resources provided.  EKG from 12/19/2023 with NSR QTc.   Medication management, continue current medications: -Acetaminophen  650 mg every 6 hours, as needed/mild pain -Albuterol  Ventolin  HFA 108 mcg 1 to 2 puff inhalation every 4 hours as needed  wheezing or shortness of breath -Amlodipine  5 mg daily/HTN -Maalox 30 mL oral every 4 hours, as needed/digestion -Hydroxyzine  25 mg every 6 hours as needed/anxiety -Loperamide  2 to 4 mg oral as needed/diarrhea or loose stools -Lorazepam  1 mg every 6 hours as needed/CIWA greater than 10 -Magnesium  hydroxide 30 mL daily as needed/mild constipation -Multivitamin 1 tablet daily -Naltrexone  50 mg oral daily -Thiamine  100 mg oral daily -Trazodone  50 mg nightly, as needed/sleep -NicoDerm 14 mg transdermal patch daily/nicotine withdrawal   Agitation protocol: MILD -Haloperidol  5 mg 3 times daily as needed mild agitation  -Diphenhydramine  50 mg p.o. 3 times daily as needed mild agitation   MODERATE -Haloperidol  5 mg IM 3 times daily as needed/moderate agitation -Diphenhydramine  50 mg IM 3 times daily as needed/moderate agitation -Lorazepam  2 mg IM 3 times daily as needed/moderate agitation   SEVERE -Haloperidol  10 mg IM 3 times daily as needed severe agitation -Diphenhydramine  50 mg IM 3 times daily as needed/severe agitation -Lorazepam  2 mg IM 3 times daily as needed/severe agitation     Discharge Planning: Disposition/case management to assist with discharge planning and identification of follow-up needs including residential or outpatient substance use treatment options as needed, prior to discharge. Patient prefers residential substance use tx. Discharge Concerns: Medication compliance and effectiveness. PCP resources. Discharge Goals: Return home with outpatient referrals for mental health follow-up including medication management/psychotherapy and substance use treatment follow-up resources as appropriate   Ellouise LITTIE Dawn, FNP 12/21/2023 2:55 PM

## 2023-12-21 NOTE — Group Note (Signed)
 Group Topic: Understanding Self  Group Date: 12/21/2023 Start Time: 1210 End Time: 1245 Facilitators: Daved Tinnie HERO, RN  Department: [facility]  Number of Participants: 8  Group Focus: psychiatric education Treatment Modality:  Psychoeducation Interventions utilized were exploration Purpose: increase insight  Name: Cristian Crawford Date of Birth: 05-Jun-1985  MR: [id number]    Level of Participation: active Quality of Participation: attentive and cooperative Interactions with others: gave feedback Mood/Affect: appropriate Triggers (if applicable): n/a Cognition: coherent/clear Progress: Gaining insight Response: pt reports prayer and getting a hair cut as a self care/ protective factor that would be helpful to them  Plan: patient will be encouraged to attend future RN education groups  Patients Problems:  Patient Active Problem List   Diagnosis Date Noted   Alcohol use disorder, moderate, dependence (HCC) 12/19/2023   Alcohol use disorder 12/19/2023

## 2023-12-21 NOTE — ED Notes (Signed)
 Pt says he feels 'great'. Pt ate breakfast, denies si hi avh- verbal contract for safety provided. Pt denies physical pain or discomforts. Pt reports that sleeping pill given last night was effective. Medications reviewed, questions denied.

## 2023-12-21 NOTE — ED Notes (Signed)
 Pt sleeping in bed, no acute distress noted. Respirations even and unlabored. Continue to monitor for safety.

## 2023-12-21 NOTE — ED Notes (Signed)
 Pt socializing in dayroom with peers, as he has done during majority of free time today. Pt denies concerns or complaints.

## 2023-12-21 NOTE — Group Note (Signed)
 Group Topic: Emotional Regulation  Group Date: 12/21/2023 Start Time: 2100 End Time: 2145 Facilitators: Luvenia Mae SAUNDERS, NT  Department: [facility]  Number of Participants: 6  Group Focus: anxiety Treatment Modality:  Leisure Development Interventions utilized were assignment Purpose: express feelings and increase insight  Name: Cristian Crawford Date of Birth: 1985-06-01  MR: [id number]    Level of Participation: active Quality of Participation: attentive Interactions with others: gave feedback Mood/Affect: appropriate Triggers (if applicable): None Cognition: coherent/clear Progress: Gaining insight Response: None Plan: patient will be encouraged to continue with group  Patients Problems:  Patient Active Problem List   Diagnosis Date Noted   Alcohol use disorder, moderate, dependence (HCC) 12/19/2023   Alcohol use disorder 12/19/2023

## 2023-12-22 DIAGNOSIS — F1010 Alcohol abuse, uncomplicated: Secondary | ICD-10-CM | POA: Diagnosis not present

## 2023-12-22 DIAGNOSIS — I10 Essential (primary) hypertension: Secondary | ICD-10-CM | POA: Diagnosis not present

## 2023-12-22 LAB — T3, FREE: T3, Free: 4.1 pg/mL (ref 2.0–4.4)

## 2023-12-22 NOTE — Group Note (Signed)
 Group Topic: Positive Affirmations  Group Date: 12/22/2023 Start Time: 1330 End Time: 1400 Facilitators: Laneta Renea POUR, NT  Department: [facility]  Number of Participants: 5  Group Focus: check in, communication, and coping skills Treatment Modality:  Psychoeducation Interventions utilized were patient education Purpose: enhance coping skills  Name: Cristian Crawford Date of Birth: Aug 21, 1985  MR: [id number]    Level of Participation: active Quality of Participation: cooperative and engaged Interactions with others: gave feedback Mood/Affect: appropriate and positive Triggers (if applicable): none Cognition: coherent/clear and insightful Progress: Gaining insight Response: I want to do what's best for my recovery.  Plan: patient will be encouraged to attend group.   Patients Problems:  Patient Active Problem List   Diagnosis Date Noted   Alcohol use disorder, moderate, dependence (HCC) 12/19/2023   Alcohol use disorder 12/19/2023

## 2023-12-22 NOTE — Care Management (Signed)
 [facility] Care Management...   Patient requested inpatient treatment..  Per Olivia @ [facility]  Patient has been accepted to their program  Patient can discharge 8:15 am    Writer coordinated at intake appointment for Wednesday 12/24/2023 @ 9:00 am  [facility] [address] [place], [place] [address] Phone: [phone]   RN will arrange transportation  7 day samples and scripts

## 2023-12-22 NOTE — Progress Notes (Signed)
 Pt is awake and alert. Sitting up in the dayroom eating breakfast.  He is maintaining appropriate boundaries with staff and peers.  No distress noted.

## 2023-12-22 NOTE — ED Notes (Signed)
 Pt sleeping in bed, no acute distress noted. Respirations even and unlabored. Continue to monitor for safety.

## 2023-12-22 NOTE — Care Management (Signed)
 [facility] Care Management...  Writer completed [employer] request for the client to go to [facility] on 12/24/23.    RN will arrange transportation on 12/24/23

## 2023-12-22 NOTE — ED Notes (Signed)
Eating dinner

## 2023-12-22 NOTE — Group Note (Signed)
 Group Topic: Recovery Basics  Group Date: 12/22/2023 Start Time: 0930 End Time: 1000 Facilitators: Belle Camellia SAILOR, RN  Department: [facility]  Number of Participants: 8 Group Focus: acceptance and relapse prevention Treatment Modality:  Solution-Focused Therapy Interventions utilized were patient education Purpose: increase insight and relapse prevention strategies  Name: Cristian Crawford Date of Birth: [birth year]  MR: [id number]    Level of Participation: moderate Quality of Participation: attentive and supportive Interactions with others: gave feedback Mood/Affect: appropriate  Cognition: logical Progress: Moderate Plan  follow-up needed  Patients Problems:  Patient Active Problem List   Diagnosis Date Noted   Alcohol use disorder, moderate, dependence (HCC) 12/19/2023   Alcohol use disorder 12/19/2023

## 2023-12-22 NOTE — Care Plan (Signed)
 Interdisciplinary Treatment and Diagnostic Plan Update  12/22/2023 Time of Session: 2pm Cristian Crawford MRN: [id number]  Diagnosis:  Final diagnoses:  Alcohol abuse  Hypertension, unspecified type     Current Medications:  Current Facility-Administered Medications  Medication Dose Route Frequency Provider Last Rate Last Admin   acetaminophen  (TYLENOL ) tablet 650 mg  650 mg Oral Q6H PRN Brent, Amanda C, NP       albuterol  (VENTOLIN  HFA) 108 (90 Base) MCG/ACT inhaler 1-2 puff  1-2 puff Inhalation Q4H PRN Brent, Amanda C, NP   2 puff at 12/22/23 0928   alum & mag hydroxide-simeth (MAALOX/MYLANTA) 200-200-20 MG/5ML suspension 30 mL  30 mL Oral Q4H PRN Brent, Amanda C, NP       amLODipine  (NORVASC ) tablet 5 mg  5 mg Oral Daily Dasie Ellouise CROME, FNP   5 mg at 12/22/23 9071   haloperidol  (HALDOL ) tablet 5 mg  5 mg Oral TID PRN Brent, Amanda C, NP       And   diphenhydrAMINE  (BENADRYL ) capsule 50 mg  50 mg Oral TID PRN Brent, Amanda C, NP       haloperidol  lactate (HALDOL ) injection 5 mg  5 mg Intramuscular TID PRN Brent, Amanda C, NP       And   diphenhydrAMINE  (BENADRYL ) injection 50 mg  50 mg Intramuscular TID PRN Brent, Amanda C, NP       And   LORazepam  (ATIVAN ) injection 2 mg  2 mg Intramuscular TID PRN Brent, Amanda C, NP       haloperidol  lactate (HALDOL ) injection 10 mg  10 mg Intramuscular TID PRN Brent, Amanda C, NP       And   diphenhydrAMINE  (BENADRYL ) injection 50 mg  50 mg Intramuscular TID PRN Brent, Amanda C, NP       And   LORazepam  (ATIVAN ) injection 2 mg  2 mg Intramuscular TID PRN Brent, Amanda C, NP       hydrocortisone  cream 1 %   Topical TID PRN Bobbitt, Shalon E, NP   Given at 12/21/23 2110   magnesium  hydroxide (MILK OF MAGNESIA) suspension 30 mL  30 mL Oral Daily PRN Brent, Amanda C, NP       multivitamin with minerals tablet 1 tablet  1 tablet Oral Daily Brent, Amanda C, NP   1 tablet at 12/22/23 0929   naltrexone  (DEPADE) tablet 50 mg  50 mg Oral Daily Brent,  Amanda C, NP   50 mg at 12/22/23 9070   thiamine  (VITAMIN B1) tablet 100 mg  100 mg Oral Daily Brent, Amanda C, NP   100 mg at 12/22/23 9070   traZODone  (DESYREL ) tablet 50 mg  50 mg Oral QHS PRN Brent, Amanda C, NP   50 mg at 12/21/23 2110   Current Outpatient Medications  Medication Sig Dispense Refill   Multiple Vitamin (MULTIVITAMIN WITH MINERALS) TABS tablet Take 1 tablet by mouth daily.     PTA Medications: Prior to Admission medications  Medication Sig Start Date End Date Taking? Authorizing Provider  Multiple Vitamin (MULTIVITAMIN WITH MINERALS) TABS tablet Take 1 tablet by mouth daily.    [provider]    Patient Stressors: Substance abuse    Patient Strengths: Printmaker for treatment/growth  Supportive family/friends   Treatment Modalities: Medication Management, Group therapy, Case management,  1 to 1 session with clinician, Psychoeducation, Recreational therapy.   Physician Treatment Plan for Primary and Secondary Diagnosis:  Final diagnoses:  Alcohol abuse  Hypertension, unspecified type  Long Term Goal(s):    Short Term Goals:    Medication Management: Evaluate patient's response, side effects, and tolerance of medication regimen.  Therapeutic Interventions: 1 to 1 sessions, Unit Group sessions and Medication administration.  Evaluation of Outcomes: Progressing  LCSW Treatment Plan for Primary Diagnosis:  Final diagnoses:  Alcohol abuse  Hypertension, unspecified type    Long Term Goal(s): Safe transition to appropriate next level of care at discharge.  Short Term Goals: Facilitate acceptance of mental health diagnosis and concerns through verbal commitment to aftercare plan and appointments at discharge. and Identify minimum of 2 triggers associated with mental health/substance abuse issues with treatment team members.  Therapeutic Interventions: Assess for all discharge needs, 1 to 1 time with Child psychotherapist, Explore  available resources and support systems, Assess for adequacy in community support network, Educate family and significant other(s) on suicide prevention, Complete Psychosocial Assessment, Interpersonal group therapy.  Evaluation of Outcomes: Progressing   Progress in Treatment: Attending groups: Yes. Participating in groups: Yes. Taking medication as prescribed: Yes. Toleration medication: No. Family/Significant other contact made: Yes, individual(s) contacted:  Dad and mom Patient understands diagnosis: Yes. Discussing patient identified problems/goals with staff: Yes. Medical problems stabilized or resolved: Yes. Denies suicidal/homicidal ideation: Yes. Issues/concerns per patient self-inventory: no Other: na  New problem(s) identified: No, Describe:  na  New Short Term/Long Term Goal(s): Client will remain at [facility] until he enters into treatment on 12/24/23 at [facility].  Patient Goals:  Attend groups and continue to work on sobriety as evidenced by remaining sober and gaining insight into his illness(s)  Discharge Plan or Barriers: client reports none other than him not giving up and asking to to leave early  Reason for Continuation of Hospitalization: Withdrawal symptoms.  Writer has arranged door to door transportation to [facility].   Providers will continue to monitor for concerns and he will continue to attend groups.  Estimated Length of Stay: 12/19/23-012/17/25  [name] Suicide Severity Risk Score: Flowsheet Row ED from 12/19/2023 in [facility] Most recent reading at 12/19/2023 12:45 PM ED from 12/19/2023 in [facility] Most recent reading at 12/19/2023 10:29 AM ED from 12/18/2023 in [facility] Emergency Department at [facility] Most recent reading at 12/18/2023 10:45 PM  C-SSRS RISK CATEGORY No Risk No Risk No Risk    Last PHQ 2/9 Scores:    12/19/2023   12:00 PM 12/19/2023   11:59 AM   Depression screen PHQ 2/9  Decreased Interest 1 2  Down, Depressed, Hopeless 1 1  PHQ - 2 Score 2 3  Altered sleeping 1 2  Tired, decreased energy 1 1  Change in appetite 0 0  Feeling bad or failure about yourself  1 0  Trouble concentrating 0 0  Moving slowly or fidgety/restless 0 0  Suicidal thoughts 0 0  PHQ-9 Score 5 6  Difficult doing work/chores Somewhat difficult Somewhat difficult    Scribe for Treatment Team: Tavien Chestnut, LCSW 12/22/2023 4:29 PM

## 2023-12-22 NOTE — Group Note (Signed)
 Group Topic: Communication  Group Date: 12/22/2023 Start Time: 2000 End Time: 2030 Facilitators: Verdon Jacqualyn BRAVO, NT  Department: [facility]  Number of Participants: 9  Group Focus: communication Treatment Modality:  Individual Therapy Interventions utilized were group exercise Purpose: improve communication skills  Name: Cristian Crawford Date of Birth: [birth year]  MR: [id number]    Level of Participation: active Quality of Participation: cooperative Interactions with others: gave feedback Mood/Affect: appropriate Triggers (if applicable): n/a Cognition: coherent/clear Progress: Moderate Response: n/a Plan: follow-up needed  Patients Problems:  Patient Active Problem List   Diagnosis Date Noted   Alcohol use disorder, moderate, dependence (HCC) 12/19/2023   Alcohol use disorder 12/19/2023

## 2023-12-22 NOTE — Group Note (Signed)
 Group Topic: Change and Accountability  Group Date: 12/22/2023 Start Time: 1100 End Time: 1200 Facilitators: Lajuana Patchell, LCSW  Department: [facility]  Number of Participants: 7  Group Focus: chemical dependency education, communication, coping skills, forgiveness, relapse prevention, self-awareness, and substance abuse education Treatment Modality:  Cognitive Behavioral Therapy, Psychoeducation, and Solution-Focused Therapy Interventions utilized were confrontation, exploration, problem solving, and support Purpose: enhance coping skills, explore maladaptive thinking, express feelings, express irrational fears, regain self-worth, reinforce self-care, and trigger / craving management  Writer explored with the group triggers related to coping with substances  and accountability.  Writer utilized Art therapy and CBT to explore triggers, forgiveness, and accountability.     Group members provided positive supportive feedback and expressed motivation for change.  Writer challenged each group members accountability of what lead them to this detox facility and if they want to change, what does that look like.   Name: Cristian Crawford Date of Birth: 1985-04-19  MR: [id number]    Level of Participation: active Quality of Participation: cooperative, intrusive, offered feedback, redirectable, and supportive Interactions with others: gave feedback Mood/Affect: appropriate, blunted, brightens with interaction, and positive Triggers (if applicable): Client reports his mother is one of his biggest triggers.  Cognition: fearful, guilty, insightful, and logical Progress: Moderate Response: Client was interactive and engaged.  Client explained his relationship with his mother and was open to positive feedback.  Plan: follow-up as needed.  Client will go to [facility] on 12/24/23.   Patients Problems:  Patient Active Problem List   Diagnosis Date Noted   Alcohol use  disorder, moderate, dependence (HCC) 12/19/2023   Alcohol use disorder 12/19/2023

## 2023-12-22 NOTE — ED Provider Notes (Signed)
 Behavioral Health Progress Note  Date and Time: 12/22/2023 1:21 PM Name: Cristian Crawford MRN:  [id number]  HPI: Cristian Crawford is a 38 year old male who presented to [facility] behavioral health urgent care on 12/19/2023.  Patient encouraged to seek assessment by [facility].  Patient request alcohol detox.  Would like to transfer to residential substance use treatment moving forward. Psychiatric history includes diagnoses of ADHD, alcohol use disorder, THC use disorder and GAD.     Per initial admission assessment: 12/19/2023- 1142am HPI: Cristian Crawford 38 y.o., male patient presented to [facility] as a voluntary walk in unaccompanied with complaints of needing to detox from alcohol so that he can transfer to [facility] for residential treatment. Pt then admitted and transferred to Facility Based Crisis unit. [name], is seen face to face by this provider and chart reviewed on 12/19/2023. Pt has a past psychiatric history of ADHD, AUD, THC use and GAD. Medical hx pertinent for asthma.  Pt has multiple ER visits with alcohol intoxication. Most recent was yesterday when he had a BAL of 219. UDS positive for THC.    On evaluation [name] reports that he left the ER this morning and went to [facility] for residential alcohol use treatment. He was referred by [facility] to come here to detox first then transfer there. He reports OK sleep and good appetite. He denies suicidal ideations, homicidal ideations and psychotic symptoms. Pt states that he was clean for about 7 months and recently relapsed a month ago on alcohol. Pt reports that his last alcohol intake was yesterday but is unsure of how much he consumed but reports drinking quite a few beers and liquor, per TTS is was 8 beers and a pint of liquor.  Pt states that he usually binge drinks alcohol a few times a week. He also endorses daily THC use. Pt denies hx of complicated withdrawals, DT's and seizures. He denies current symptoms of alcohol  withdrawal. No signs of withdrawal observed. Pt previously completed residential program at [facility] for cocaine and alcohol use last year.    Pt reports a hx of anxiety and ADHD for which he was trialed on Buspar, Strattera and Qelbree in May 2025 but did not feel much difference so he discontinued treatment. Today pt denies concerns involving his mental health and does not feel that he needs treatment for depression and anxiety. His PHQ-9 scored a 5 indicating very mild depressive symptoms which are likely situational and due to his alcohol relapse. He denies hx of self harm and suicide attempts. Pt also denies history of inpatient psychiatric treatment.    During evaluation [name] is sitting up is assessment room, in no acute distress.  He is alert & oriented x 4, calm, cooperative and attentive for this assessment.  His mood is euthymic with congruent affect.  He has normal speech, and behavior.  Objectively there is no evidence of psychosis/mania or delusional thinking. Pt does not appear to be responding to internal or external stimuli.  Patient is able to converse coherently, goal directed thoughts, no distractibility, or pre-occupation.  He denies current  suicidal/self-harm/homicidal ideation, psychosis, and paranoia.  Patient answered assessment questions appropriately.   Patient reassessment 12/22/2023. Chart reviewed and patient discussed with attending psychiatrist, Dr. Cole. Patient is reassessed by this nurse practitioner face-to-face.  Patient is seated, no apparent distress.  He is alert and oriented, pleasant and cooperative during assessment.  Patient denies pain, denies physical complaint.  Patient presents with euthymic mood, congruent affect.  Cristian Crawford shares I have a decent job and I have child support.  I have a court order to pay [phone] and child support so that I do not go to jail.  Patient is committed to sobriety and would like to seek residential substance use  treatment.  Residential substance use treatment has been effective for him in the past.  He is conflicted because he is currently approximately $16,000 behind on his child support and he is concerned he may be placed in jail if he does not continue to work and instead goes to residential substance use treatment.  [name] states I know that I need to do this, I need to get better and I still take care of my kids.  Patient is actively engaged in groups.  His behavior is appropriate with staff and peers.  He attends meals in common area.  He is compliant with medications.  Patient endorses average sleep and appetite.  [name] continues to deny SI/HI/AVH.  There is no indication that patient is responding to internal stimuli and no evidence of delusional thought content.  Patient offered support and encouragement.  He verbalizes understanding of current treatment plan to include potential discharge on 12/24/2023 with admission appointment to [facility].   Diagnosis:  Final diagnoses:  Alcohol abuse  Hypertension, unspecified type    Total Time spent with patient: 20 minutes  Past Psychiatric History: GAD, AUD, ADHD. Past Medical History: Asthma Family History: Mother: Hypertension.  Father: Hypertension. Family Psychiatric  History: None reported Social History: Patient resides in [place] with his brother.  He is currently employed.  He denies access to weapons.  Endorses frequent alcohol and THC use. Additional Social History:        Sleep: Good  Appetite:  Good  Current Medications:  Current Facility-Administered Medications  Medication Dose Route Frequency Provider Last Rate Last Admin   acetaminophen  (TYLENOL ) tablet 650 mg  650 mg Oral Q6H PRN Brent, Amanda C, NP       albuterol  (VENTOLIN  HFA) 108 (90 Base) MCG/ACT inhaler 1-2 puff  1-2 puff Inhalation Q4H PRN Brent, Amanda C, NP   2 puff at 12/22/23 0928   alum & mag hydroxide-simeth (MAALOX/MYLANTA) 200-200-20  MG/5ML suspension 30 mL  30 mL Oral Q4H PRN Brent, Amanda C, NP       amLODipine  (NORVASC ) tablet 5 mg  5 mg Oral Daily Dasie Ellouise CROME, FNP   5 mg at 12/22/23 9071   haloperidol  (HALDOL ) tablet 5 mg  5 mg Oral TID PRN Brent, Amanda C, NP       And   diphenhydrAMINE  (BENADRYL ) capsule 50 mg  50 mg Oral TID PRN Brent, Amanda C, NP       haloperidol  lactate (HALDOL ) injection 5 mg  5 mg Intramuscular TID PRN Brent, Amanda C, NP       And   diphenhydrAMINE  (BENADRYL ) injection 50 mg  50 mg Intramuscular TID PRN Brent, Amanda C, NP       And   LORazepam  (ATIVAN ) injection 2 mg  2 mg Intramuscular TID PRN Brent, Amanda C, NP       haloperidol  lactate (HALDOL ) injection 10 mg  10 mg Intramuscular TID PRN Thresa Alan BROCKS, NP       And   diphenhydrAMINE  (BENADRYL ) injection 50 mg  50 mg Intramuscular TID PRN Brent, Amanda C, NP       And   LORazepam  (ATIVAN ) injection 2 mg  2 mg Intramuscular TID PRN Brent, Amanda C,  NP       hydrocortisone  cream 1 %   Topical TID PRN Bobbitt, Shalon E, NP   Given at 12/21/23 2110   magnesium  hydroxide (MILK OF MAGNESIA) suspension 30 mL  30 mL Oral Daily PRN Brent, Amanda C, NP       multivitamin with minerals tablet 1 tablet  1 tablet Oral Daily Brent, Amanda C, NP   1 tablet at 12/22/23 9070   naltrexone  (DEPADE) tablet 50 mg  50 mg Oral Daily Brent, Amanda C, NP   50 mg at 12/22/23 9070   thiamine  (VITAMIN B1) tablet 100 mg  100 mg Oral Daily Brent, Amanda C, NP   100 mg at 12/22/23 9070   traZODone  (DESYREL ) tablet 50 mg  50 mg Oral QHS PRN Brent, Amanda C, NP   50 mg at 12/21/23 2110   Current Outpatient Medications  Medication Sig Dispense Refill   Multiple Vitamin (MULTIVITAMIN WITH MINERALS) TABS tablet Take 1 tablet by mouth daily.      Labs  Lab Results:  Admission on 12/19/2023  Component Date Value Ref Range Status   T3, Free 12/20/2023 4.1  2.0 - 4.4 pg/mL Final   Comment: (NOTE) Performed At: [facility] [address] [place], [place]  [id number] Jennette Shorter MD Ey:[id number]    Free T4 12/20/2023 0.68  0.61 - 1.12 ng/dL Final   Comment: (NOTE) Biotin ingestion may interfere with free T4 tests. If the results are inconsistent with the TSH level, previous test results, or the clinical presentation, then consider biotin interference. If needed, order repeat testing after stopping biotin. Performed at [facility], [address], [place], [place] [address]   Admission on 12/19/2023, Discharged on 12/19/2023  Component Date Value Ref Range Status   TSH 12/19/2023 0.149 (L)  0.350 - 4.500 uIU/mL Final   Comment: Performed by a 3rd Generation assay with a functional sensitivity of <=0.01 uIU/mL. Performed at [facility], [address] [place], [place] [address]    Alcohol, Ethyl (B) 12/19/2023 <15  <15 mg/dL Final   Comment: (NOTE) For medical purposes only. Performed at [facility], [address], [place], [place] [address]    Magnesium  12/19/2023 2.2  1.7 - 2.4 mg/dL Final   Performed at [facility], [address], [place], [place] [address]   Cholesterol 12/19/2023 130  0 - 200 mg/dL Final   Triglycerides 87/87/7974 43  <150 mg/dL Final   HDL 87/87/7974 66  >40 mg/dL Final   Total CHOL/HDL Ratio 12/19/2023 2.0  RATIO Final   VLDL 12/19/2023 9  0 - 40 mg/dL Final   LDL Cholesterol 12/19/2023 55  0 - 99 mg/dL Final   Comment:        Total Cholesterol/HDL:CHD Risk Coronary Heart Disease Risk Table                     Men   Women  1/2 Average Risk   3.4   3.3  Average Risk       5.0   4.4  2 X Average Risk   9.6   7.1  3 X Average Risk  23.4   11.0        Use the calculated Patient Ratio above and the CHD Risk Table to determine the patient's CHD Risk.        ATP III CLASSIFICATION (LDL):  <100     mg/dL   Optimal  899-870  mg/dL   Near or Above                    Optimal  130-159  mg/dL   Borderline  839-810  mg/dL   High  >809     mg/dL   Very High Performed at [facility], [address], [place], [place] [address]    POC Amphetamine UR 12/19/2023 None Detected  NONE DETECTED (Cut Off Level 1000 ng/mL) Final   POC Secobarbital (BAR) 12/19/2023 None Detected  NONE DETECTED (Cut Off Level 300 ng/mL) Final   POC Buprenorphine (BUP) 12/19/2023 None Detected  NONE DETECTED (Cut Off Level 10 ng/mL) Final   POC Oxazepam (BZO) 12/19/2023 None Detected  NONE DETECTED (Cut Off Level 300 ng/mL) Final   POC Cocaine UR 12/19/2023 None Detected  NONE DETECTED (Cut Off Level 300 ng/mL) Final   POC Methamphetamine UR 12/19/2023 None Detected  NONE DETECTED (Cut Off Level 1000 ng/mL) Final   POC Morphine 12/19/2023 None Detected  NONE DETECTED (Cut Off Level 300 ng/mL) Final   POC Methadone UR 12/19/2023 None Detected  NONE DETECTED (Cut Off Level 300 ng/mL) Final   POC Oxycodone UR 12/19/2023 None Detected  NONE DETECTED (Cut Off Level 100 ng/mL) Final   POC Marijuana UR 12/19/2023 Positive (A)  NONE DETECTED (Cut Off Level 50 ng/mL) Final  Admission on 12/18/2023, Discharged on 12/19/2023  Component Date Value Ref Range Status   Sodium 12/18/2023 145  135 - 145 mmol/L Final   Potassium 12/18/2023 3.8  3.5 - 5.1 mmol/L Final   Chloride 12/18/2023 107  98 - 111 mmol/L Final   CO2 12/18/2023 23  22 - 32 mmol/L Final   Glucose, Bld 12/18/2023 82  70 - 99 mg/dL Final   Glucose reference range applies only to samples taken after fasting for at least 8 hours.   BUN 12/18/2023 11  6 - 20 mg/dL Final   Creatinine, Ser 12/18/2023 0.94  0.61 - 1.24 mg/dL Final   Calcium 87/88/7974 8.3 (L)  8.9 - 10.3 mg/dL Final   Total Protein 87/88/7974 7.6  6.5 - 8.1 g/dL Final   Albumin 87/88/7974 4.5  3.5 - 5.0 g/dL Final   AST 87/88/7974 21  15 - 41 U/L Final   ALT 12/18/2023 7  0 - 44 U/L Final   Alkaline Phosphatase 12/18/2023 101  38 - 126 U/L Final   Total Bilirubin 12/18/2023 <0.2  0.0 - 1.2 mg/dL Final   GFR, Estimated 12/18/2023 >60  >60 mL/min Final   Comment:  (NOTE) Calculated using the CKD-EPI Creatinine Equation (2021)    Anion gap 12/18/2023 14  5 - 15 Final   Performed at [facility], [address], [place], [place] [address]   Alcohol, Ethyl (B) 12/18/2023 219 (H)  <15 mg/dL Final   Comment: (NOTE) For medical purposes only. Performed at [facility], [address], [place], [place] [address]    WBC 12/18/2023 11.1 (H)  4.0 - 10.5 K/uL Final   RBC 12/18/2023 4.65  4.22 - 5.81 MIL/uL Final   Hemoglobin 12/18/2023 14.5  13.0 - 17.0 g/dL Final   HCT 87/88/7974 43.0  39.0 - 52.0 % Final   MCV 12/18/2023 92.5  80.0 - 100.0 fL Final   MCH 12/18/2023 31.2  26.0 - 34.0 pg Final   MCHC 12/18/2023 33.7  30.0 - 36.0 g/dL Final   RDW 87/88/7974 13.1  11.5 - 15.5 % Final  Platelets 12/18/2023 385  150 - 400 K/uL Final   nRBC 12/18/2023 0.0  0.0 - 0.2 % Final   Performed at [facility], [address] [place], [place] [address]    Blood Alcohol level:  Lab Results  Component Value Date   [facility] <15 12/19/2023   ETH 219 (H) 12/18/2023    Metabolic Disorder Labs: No results found for: HGBA1C, MPG No results found for: PROLACTIN Lab Results  Component Value Date   CHOL 130 12/19/2023   TRIG 43 12/19/2023   HDL 66 12/19/2023   CHOLHDL 2.0 12/19/2023   VLDL 9 12/19/2023   LDLCALC 55 12/19/2023    Therapeutic Lab Levels: No results found for: LITHIUM No results found for: VALPROATE No results found for: CBMZ  Physical Findings   PHQ2-9    Flowsheet Row ED from 12/19/2023 in [facility] Most recent reading at 12/19/2023 12:00 PM ED from 12/19/2023 in [facility] Most recent reading at 12/19/2023 11:59 AM  PHQ-2 Total Score 2 3  PHQ-9 Total Score 5 6   Flowsheet Row ED from 12/19/2023 in [facility] Most recent reading at 12/19/2023 12:45 PM ED from 12/19/2023 in [facility] Most recent reading at 12/19/2023 10:29 AM ED from 12/18/2023 in [facility] Emergency Department at [facility] Most recent reading at 12/18/2023 10:45 PM  C-SSRS RISK CATEGORY No Risk No Risk No Risk     Musculoskeletal  Strength & Muscle Tone: within normal limits Gait & Station: normal Patient leans: N/A  Psychiatric Specialty Exam  Presentation  General Appearance:  Appropriate for Environment; Casual  Eye Contact: Good  Speech: Clear and Coherent; Normal Rate  Speech Volume: Normal  Handedness: Right   Mood and Affect  Mood: Euthymic  Affect: Appropriate; Congruent   Thought Process  Thought Processes: Coherent; Goal Directed; Linear  Descriptions of Associations:Intact  Orientation:Full (Time, Place and Person)  Thought Content:Logical; WDL  Diagnosis of Schizophrenia or Schizoaffective disorder in past: No    Hallucinations:Hallucinations: None  Ideas of Reference:None  Suicidal Thoughts:Suicidal Thoughts: No  Homicidal Thoughts:Homicidal Thoughts: No   Sensorium  Memory: Immediate Good; Recent Good  Judgment: Good  Insight: Good   Executive Functions  Concentration: Good  Attention Span: Good  Recall: Good  Fund of Knowledge: Good  Language: Good   Psychomotor Activity  Psychomotor Activity: Psychomotor Activity: Normal   Assets  Assets: Communication Skills; Desire for Improvement; Financial Resources/Insurance; Resilience; Social Support; Vocational/Educational   Sleep  Sleep: Sleep: Good  Estimated Sleeping Duration (Last 24 Hours): 8.00-9.25 hours  No data recorded  Physical Exam  Physical Exam Vitals and nursing note reviewed.  Constitutional:      Appearance: Normal appearance. He is well-developed.  HENT:     Head: Normocephalic.     Nose: Nose normal.  Cardiovascular:     Rate and Rhythm: Normal rate.  Pulmonary:     Effort: Pulmonary effort is  normal.  Musculoskeletal:     Cervical back: Normal range of motion.  Neurological:     Mental Status: He is alert and oriented to person, place, and time.  Psychiatric:        Attention and Perception: Attention and perception normal.        Mood and Affect: Mood and affect normal.        Speech: Speech normal.        Behavior: Behavior normal. Behavior is cooperative.  Thought Content: Thought content normal.        Cognition and Memory: Cognition and memory normal.        Judgment: Judgment normal.    Review of Systems  Constitutional: Negative.   HENT: Negative.    Eyes: Negative.   Respiratory: Negative.    Cardiovascular: Negative.   Gastrointestinal: Negative.   Genitourinary: Negative.   Musculoskeletal: Negative.   Skin: Negative.   Neurological: Negative.   Psychiatric/Behavioral:  Positive for substance abuse.    Blood pressure (!) 114/96, pulse 77, temperature 97.7 F (36.5 C), resp. rate 17, SpO2 100%. There is no height or weight on file to calculate BMI.  Treatment Plan Summary: Daily contact with patient to assess and evaluate symptoms and progress in treatment  Medication management, continue current medications: -Acetaminophen  650 mg every 6 hours, as needed/mild pain -Albuterol  Ventolin  HFA 108 mcg 1 to 2 puff inhalation every 4 hours as needed wheezing or shortness of breath -Amlodipine  5 mg daily/HTN -Maalox 30 mL oral every 4 hours, as needed/digestion -Hydroxyzine  25 mg every 6 hours as needed/anxiety -Loperamide  2 to 4 mg oral as needed/diarrhea or loose stools -Lorazepam  1 mg every 6 hours as needed/CIWA greater than 10 -Magnesium  hydroxide 30 mL daily as needed/mild constipation -Multivitamin 1 tablet daily -Naltrexone  50 mg oral daily -Thiamine  100 mg oral daily -Trazodone  50 mg nightly, as needed/sleep -NicoDerm 14 mg transdermal patch daily/nicotine withdrawal   Agitation protocol: MILD -Haloperidol  5 mg 3 times daily as needed  mild agitation  -Diphenhydramine  50 mg p.o. 3 times daily as needed mild agitation   MODERATE -Haloperidol  5 mg IM 3 times daily as needed/moderate agitation -Diphenhydramine  50 mg IM 3 times daily as needed/moderate agitation -Lorazepam  2 mg IM 3 times daily as needed/moderate agitation   SEVERE -Haloperidol  10 mg IM 3 times daily as needed severe agitation -Diphenhydramine  50 mg IM 3 times daily as needed/severe agitation -Lorazepam  2 mg IM 3 times daily as needed/severe agitation     Discharge Planning: Per disposition/case management patient will be accepted to [facility] for residential substance use treatment.  Patient can arrive to [facility] facility on 12/24/2023.  Current plan includes discharge 12/24/2023.  Patient verbalizes understanding agreement with plan. Discharge Concerns: Medication compliance and effectiveness. PCP resources. Discharge Goals: Return home with outpatient referrals for mental health follow-up including medication management/psychotherapy and substance use treatment follow-up resources as appropriate   Ellouise LITTIE Dawn, FNP 12/22/2023 1:21 PM

## 2023-12-22 NOTE — ED Notes (Signed)
°   12/22/23 1023  Psych Admission Type (Psych Patients Only)  Admission Status Voluntary  Psychosocial Assessment  Patient Complaints Substance abuse  Eye Contact Fair  Facial Expression Animated  Affect Appropriate to circumstance  Speech Logical/coherent  Interaction Assertive  Motor Activity Other (Comment) (steady)  Appearance/Hygiene Unremarkable  Behavior Characteristics Cooperative;Appropriate to situation  Mood Pleasant  Thought Process  Coherency WDL  Content WDL  Delusions WDL  Perception WDL  Hallucination None reported or observed  Judgment WDL  Confusion WDL  Danger to Self  Current suicidal ideation? Denies  Description of Suicide Plan d  Agreement Not to Harm Self Yes  Description of Agreement verbal  Danger to Others  Danger to Others None reported or observed

## 2023-12-22 NOTE — ED Notes (Signed)
 Patient denies pain and is resting comfortably.

## 2023-12-22 NOTE — ED Notes (Signed)
 Pt received in the community room awake appropriate safety maintained.

## 2023-12-23 DIAGNOSIS — F1010 Alcohol abuse, uncomplicated: Secondary | ICD-10-CM

## 2023-12-23 DIAGNOSIS — I10 Essential (primary) hypertension: Secondary | ICD-10-CM

## 2023-12-23 MED ORDER — ALBUTEROL SULFATE HFA 108 (90 BASE) MCG/ACT IN AERS: 1.0000 | INHALATION_SPRAY | RESPIRATORY_TRACT | 0 refills | Status: DC | PRN

## 2023-12-23 MED ORDER — TRAZODONE HCL 50 MG PO TABS: 50.0000 mg | ORAL_TABLET | Freq: Every evening | ORAL | 0 refills | Status: AC | PRN

## 2023-12-23 MED ORDER — VITAMIN B-1 100 MG PO TABS: 100.0000 mg | ORAL_TABLET | Freq: Every day | ORAL | 0 refills | Status: AC

## 2023-12-23 MED ORDER — AMLODIPINE BESYLATE 5 MG PO TABS: 5.0000 mg | ORAL_TABLET | Freq: Every day | ORAL | 0 refills | Status: AC

## 2023-12-23 MED ORDER — HYDROCORTISONE 1 % EX CREA: TOPICAL_CREAM | Freq: Three times a day (TID) | CUTANEOUS | 0 refills | Status: AC | PRN

## 2023-12-23 MED ORDER — NALTREXONE HCL 50 MG PO TABS: 50.0000 mg | ORAL_TABLET | Freq: Every day | ORAL | 0 refills | Status: AC

## 2023-12-23 NOTE — Group Note (Deleted)
 Group Topic: Relapse and Recovery  Group Date: 12/23/2023 Start Time: 1300 End Time: 1330 Facilitators: Clodfelter-Simmons, Veneda Kirksey L, NT  Department: [facility]  Number of Participants: 7  Group Focus: relapse prevention Treatment Modality:  Psychoeducation Interventions utilized were patient education and problem solving Purpose: relapse prevention strategies   Name: Ashby Moskal Date of Birth: Nov 03, 1985  MR: [id number]    Level of Participation: {THERAPIES; PSYCH GROUP PARTICIPATION OZCZO:76008} Quality of Participation: {THERAPIES; PSYCH QUALITY OF PARTICIPATION:23992} Interactions with others: {THERAPIES; PSYCH INTERACTIONS:23993} Mood/Affect: {THERAPIES; PSYCH MOOD/AFFECT:23994} Triggers (if applicable): *** Cognition: {THERAPIES; PSYCH COGNITION:23995} Progress: {THERAPIES; PSYCH PROGRESS:23997} Response: *** Plan: {THERAPIES; PSYCH EOJW:76003}  Patients Problems:  Patient Active Problem List   Diagnosis Date Noted   Alcohol use disorder, moderate, dependence (HCC) 12/19/2023   Alcohol use disorder 12/19/2023

## 2023-12-23 NOTE — Group Note (Signed)
 Group Topic: Healthy Self Image and Positive Change  Group Date: 12/23/2023 Start Time: 2030 End Time: 2100 Facilitators: Anice Benton LABOR, NT  Department: [facility]  Number of Participants: 4  Group Focus: clarity of thought, discharge education, and feeling awareness/expression Treatment Modality:  Patient-Centered Therapy Interventions utilized were group exercise and support Purpose: explore maladaptive thinking and express feelings  Name: Cristian Crawford Date of Birth: 1985/10/02  MR: [id number]    Level of Participation: active Quality of Participation: attentive and cooperative Interactions with others: gave feedback Mood/Affect: appropriate and positive Triggers (if applicable): N/A Cognition: goal directed Progress: Moderate Response: Good Plan: follow-up needed  Patients Problems:  Patient Active Problem List   Diagnosis Date Noted   Alcohol use disorder, moderate, dependence (HCC) 12/19/2023   Alcohol use disorder 12/19/2023

## 2023-12-23 NOTE — ED Notes (Signed)
 Pt presents as somewhat flat, but calm and cooperative. Pt denies si hi and avh- verbal contract for safety provided. Pt says he feels 'great'. Pt denies physical pain and discomfort, no s/s of asthma at this time reported or observed by RN. Medications reviewed, questions denied.

## 2023-12-23 NOTE — ED Notes (Signed)
 Pt currently attending and participating in group, no distress observed or reported. Pt ate lunch.

## 2023-12-23 NOTE — Group Note (Signed)
 Group Topic: Understanding Self  Group Date: 12/23/2023 Start Time: 1200 End Time: 1230 Facilitators: Daved Tinnie HERO, RN  Department: [facility]  Number of Participants: 7  Group Focus: psychiatric education Treatment Modality:  Psychoeducation Interventions utilized were exploration Purpose: increase insight  Name: Timtohy Broski Date of Birth: 01-11-1985  MR: [id number]    Level of Participation: moderate Quality of Participation: attentive and cooperative Interactions with others: gave feedback Mood/Affect: appropriate Triggers (if applicable): n/a Cognition: coherent/clear Progress: Gaining insight Response: pt says they would like their future self to be a better uncle, father, and son, would also not be so hard on self Plan: patient will be encouraged to attend future RN education groups   Patients Problems:  Patient Active Problem List   Diagnosis Date Noted   Alcohol use disorder, moderate, dependence (HCC) 12/19/2023   Alcohol use disorder 12/19/2023

## 2023-12-23 NOTE — ED Notes (Signed)
 Pt sleeping long intervals through night. Repositioning in bed.  In no apparent distress.  Safety maintained.

## 2023-12-23 NOTE — ED Notes (Signed)
 Pt received in community room interacting appropriately with peers.  Denies thought of SI.  Safety maintained

## 2023-12-23 NOTE — ED Provider Notes (Signed)
 FBC/OBS ASAP Discharge Summary  Date and Time: 12/23/2023 3:31 PM Name: Cristian Crawford  MRN:  [id number]   Discharge Diagnoses:  Final diagnoses:  Alcohol abuse  Hypertension, unspecified type   Subjective: On face-to-face evaluation, the patient was actively engaged with his peers on the unit. Assessment was completed in his room to ensure privacy. He is in no acute distress. He is alert and oriented x 4, calm, and cooperative during his assessment. He denies any withdrawal symptoms. Reports good sleep and appetite. He reports that his energy has improved/increased. Reports feeling a little anxious due to thinking about his life, work, and children. He reports turning to alcohol when he wants to relax after returning back home from work. He reports using alcohol to relax and chill. He reports that people get on his nerves at work, so he comes home wanting to drink and it's never just 1 drink for him. Identifies that he needs to stay busy to abstain from alcohol and drug use. 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 He reports tolerating his medications well without any side effects. His mood is euthymic and his affect is congruent with his affect. His speech is clear, coherent, and logical. He has been present in the milieu, attending groups, and interacting with her peers well. Objectively, there is no evidence of psychosis, mania, or delusional thinking. He doesn't appear to be responding to internal or external stimuli. He is able to converse coherently with goal-directed thoughts with no distractibility or pre-occupation. He denies suicidal and homicidal ideations. He denies psychosis and mania. He denies auditory and visual hallucinations. He verbalizes readiness to discharge to [facility] on 12/24/2023.  Labs reviewed: CMP with GFR on 12/18/2023 within normal ranges besides low calcium at 8.3. CBC on 12/18/2023 within  normal ranges besides WBC mildly elevated at 11.1, no signs of infection. Magnesium  from 12/19/2023 normal. Lipid profile normal from 12/19/2023. TSH low at 0.149 on 12/19/2023. T3 and T4 checked on 12/20/2023 were within normal ranges. UDS positive for marijuana on 12/19/2023. BAL on 12/18/2023 was elevated at 219. EKG from 12/19/2023 NSR with right atrial enlargement with moderate voltage criteria for LVH, ventricular rate of 89 beats per minutes, and QT/QTcB of . Patient to follow-up with PCP regarding EKG findings and ongoing management of HTN. Discharge information/education includes food containing calcium to incorporate into his diet in order to improve calcium levels.  Stay Summary: He was admitted to the facility based crisis unit on 12/19/2023 and HPI by Alan Mcardle, NP notes: Cristian Crawford 38 y.o., male patient presented to [facility] as a voluntary walk in unaccompanied with complaints of needing to detox from alcohol so that he can transfer to [facility] for residential treatment. Pt then admitted and transferred to Facility Based Crisis unit. Cristian Crawford, is seen face to face by this provider and chart reviewed on 12/19/2023. Pt has a past psychiatric history of ADHD, AUD, THC use and GAD. Medical hx pertinent for asthma.  Pt has multiple ER visits with alcohol intoxication. Most recent was yesterday when he had a BAL of 219. UDS positive for THC. On evaluation Cristian Crawford reports that he left the ER this morning and went to [facility] for residential alcohol use treatment. He was referred by [facility] to come here to detox first then transfer there. He reports OK sleep and good appetite. He denies suicidal ideations, homicidal ideations and psychotic symptoms. Pt states that he  was clean for about 7 months and recently relapsed a month ago on alcohol. Pt reports that his last alcohol intake was yesterday but is unsure of how much he consumed but reports drinking quite a few beers and liquor, per  TTS is was 8 beers and a pint of liquor.  Pt states that he usually binge drinks alcohol a few times a week. He also endorses daily THC use. Pt denies hx of complicated withdrawals, DT's and seizures. He denies current symptoms of alcohol withdrawal. No signs of withdrawal observed. Pt previously completed residential program at [facility] for cocaine and alcohol use last year. Pt reports a hx of anxiety and ADHD for which he was trialed on Buspar, Strattera and Qelbree in May 2025 but did not feel much difference so he discontinued treatment. Today pt denies concerns involving his mental health and does not feel that he needs treatment for depression and anxiety. His PHQ-9 scored a 5 indicating very mild depressive symptoms which are likely situational and due to his alcohol relapse. He denies hx of self harm and suicide attempts. Pt also denies history of inpatient psychiatric treatment. During evaluation Cristian Crawford is sitting up is assessment room, in no acute distress.  He is alert & oriented x 4, calm, cooperative and attentive for this assessment.  His mood is euthymic with congruent affect.  He has normal speech, and behavior.  Objectively there is no evidence of psychosis/mania or delusional thinking. Pt does not appear to be responding to internal or external stimuli.  Patient is able to converse coherently, goal directed thoughts, no distractibility, or pre-occupation.  He denies current  suicidal/self-harm/homicidal ideation, psychosis, and paranoia.  Patient answered assessment questions appropriately.   CIWA was monitored and he had PRN ativan  for alcohol related withdrawal symptoms. His medication regimen during this stay included: -multivitamin daily for nutrient replenishment -naltrexone  50 mg daily for alcohol cravings -thiamine  100 mg daily for supplementation -amlodipine  (norvasc ) 5 mg by mouth daily for HTN -hydrocortisone  cream 1% tid PRN for itching -trazodone  50 mg by mouth  at bedtime PRN for sleep  As needed medications: -Tylenol  650 mg every 6 hours as needed for pain -Albuterol  inhaler every 4 as needed for wheezing or SHOB -Hydroxyzine  25 mg every 6 hours as needed for anxiety -Loperamide  2-4 mg as needed for diarrhea -Ondansetron  4 mg every 6 hours as needed for nausea vomiting -Trazodone  50 mg nightly as needed for sleep -Milk of magnesia 30 mL daily as needed for constipation -Maalox every 4 hours as needed for indigestion -agitation protocol as needed  Patient remained actively engaged in the milieu. Attended groups and meals in common area. He exhibited appropriate behavior with staff and peers.   Patient was evaluated each day by a clinical provider to ascertain response to treatment.  Improvement noted by patient's report of decreasing symptoms, improved sleep and appetite, affect, medication tolerance, behavior, and participation in unit programming.  Patient currently denies symptoms related to alcohol withdrawal. Patient reports perceived benefit to facility based crisis unit stay.  Patient did attend groups and actively engaged including AA groups.  Total Time spent with patient: 15 minutes  Past Psychiatric History: GAD, AUD, and ADHD. Past Medical History:  Past Medical History:  Diagnosis Date   Asthma    Family History: Mother (HTN), Father (HTN) Family Psychiatric History: None reported. Social History: Patient resides in [place] with his brother. He is currently employed. He denies access to weapons. Endorses frequent alcohol and THC use.  Tobacco Cessation:  N/A, patient does not currently use tobacco products  Current Medications:  Current Facility-Administered Medications  Medication Dose Route Frequency Provider Last Rate Last Admin   acetaminophen  (TYLENOL ) tablet 650 mg  650 mg Oral Q6H PRN Brent, Amanda C, NP   650 mg at 12/23/23 0359   albuterol  (VENTOLIN  HFA) 108 (90 Base) MCG/ACT inhaler 1-2 puff  1-2 puff Inhalation  Q4H PRN Brent, Amanda C, NP   2 puff at 12/22/23 2151   alum & mag hydroxide-simeth (MAALOX/MYLANTA) 200-200-20 MG/5ML suspension 30 mL  30 mL Oral Q4H PRN Brent, Amanda C, NP       amLODipine  (NORVASC ) tablet 5 mg  5 mg Oral Daily Dasie Ellouise CROME, FNP   5 mg at 12/23/23 9075   haloperidol  (HALDOL ) tablet 5 mg  5 mg Oral TID PRN Brent, Amanda C, NP       And   diphenhydrAMINE  (BENADRYL ) capsule 50 mg  50 mg Oral TID PRN Brent, Amanda C, NP       haloperidol  lactate (HALDOL ) injection 5 mg  5 mg Intramuscular TID PRN Brent, Amanda C, NP       And   diphenhydrAMINE  (BENADRYL ) injection 50 mg  50 mg Intramuscular TID PRN Thresa Alan BROCKS, NP       And   LORazepam  (ATIVAN ) injection 2 mg  2 mg Intramuscular TID PRN Brent, Amanda C, NP       haloperidol  lactate (HALDOL ) injection 10 mg  10 mg Intramuscular TID PRN Brent, Amanda C, NP       And   diphenhydrAMINE  (BENADRYL ) injection 50 mg  50 mg Intramuscular TID PRN Brent, Amanda C, NP       And   LORazepam  (ATIVAN ) injection 2 mg  2 mg Intramuscular TID PRN Brent, Amanda C, NP       hydrocortisone  cream 1 %   Topical TID PRN Bobbitt, Shalon E, NP   1 Application at 12/23/23 0925   magnesium  hydroxide (MILK OF MAGNESIA) suspension 30 mL  30 mL Oral Daily PRN Brent, Amanda C, NP       multivitamin with minerals tablet 1 tablet  1 tablet Oral Daily Brent, Amanda C, NP   1 tablet at 12/23/23 0924   naltrexone  (DEPADE) tablet 50 mg  50 mg Oral Daily Brent, Amanda C, NP   50 mg at 12/23/23 9074   thiamine  (VITAMIN B1) tablet 100 mg  100 mg Oral Daily Brent, Amanda C, NP   100 mg at 12/23/23 9074   traZODone  (DESYREL ) tablet 50 mg  50 mg Oral QHS PRN Brent, Amanda C, NP   50 mg at 12/22/23 2150   Current Outpatient Medications  Medication Sig Dispense Refill   Multiple Vitamin (MULTIVITAMIN WITH MINERALS) TABS tablet Take 1 tablet by mouth daily.      PTA Medications:  Facility Ordered Medications  Medication   [COMPLETED] ipratropium-albuterol   (DUONEB) 0.5-2.5 (3) MG/3ML nebulizer solution 6 mL   [COMPLETED] albuterol  (VENTOLIN  HFA) 108 (90 Base) MCG/ACT inhaler 1-2 puff   naltrexone  (DEPADE) tablet 50 mg   acetaminophen  (TYLENOL ) tablet 650 mg   alum & mag hydroxide-simeth (MAALOX/MYLANTA) 200-200-20 MG/5ML suspension 30 mL   magnesium  hydroxide (MILK OF MAGNESIA) suspension 30 mL   thiamine  (VITAMIN B1) tablet 100 mg   multivitamin with minerals tablet 1 tablet   [EXPIRED] LORazepam  (ATIVAN ) tablet 1 mg   [EXPIRED] hydrOXYzine  (ATARAX ) tablet 25 mg   [EXPIRED] loperamide  (IMODIUM ) capsule 2-4 mg   [EXPIRED]  ondansetron  (ZOFRAN -ODT) disintegrating tablet 4 mg   haloperidol  (HALDOL ) tablet 5 mg   And   diphenhydrAMINE  (BENADRYL ) capsule 50 mg   haloperidol  lactate (HALDOL ) injection 5 mg   And   diphenhydrAMINE  (BENADRYL ) injection 50 mg   And   LORazepam  (ATIVAN ) injection 2 mg   haloperidol  lactate (HALDOL ) injection 10 mg   And   diphenhydrAMINE  (BENADRYL ) injection 50 mg   And   LORazepam  (ATIVAN ) injection 2 mg   traZODone  (DESYREL ) tablet 50 mg   albuterol  (VENTOLIN  HFA) 108 (90 Base) MCG/ACT inhaler 1-2 puff   amLODipine  (NORVASC ) tablet 5 mg   hydrocortisone  cream 1 %       12/23/2023    9:17 AM 12/19/2023   12:00 PM 12/19/2023   11:59 AM  Depression screen PHQ 2/9  Decreased Interest 0 1 2  Down, Depressed, Hopeless 0 1 1  PHQ - 2 Score 0 2 3  Altered sleeping 0 1 2  Tired, decreased energy 0 1 1  Change in appetite 2 0 0  Feeling bad or failure about yourself  0 1 0  Trouble concentrating 0 0 0  Moving slowly or fidgety/restless 0 0 0  Suicidal thoughts 0 0 0  PHQ-9 Score 2 5 6   Difficult doing work/chores Not difficult at all Somewhat difficult Somewhat difficult    Flowsheet Row ED from 12/19/2023 in [facility] Most recent reading at 12/19/2023 12:45 PM ED from 12/19/2023 in [facility] Most recent reading at 12/19/2023 10:29 AM ED  from 12/18/2023 in [facility] Department at [facility] Most recent reading at 12/18/2023 10:45 PM  C-SSRS RISK CATEGORY No Risk No Risk No Risk    Musculoskeletal  Strength & Muscle Tone: within normal limits Gait & Station: normal Patient leans: N/A  Psychiatric Specialty Exam  Presentation  General Appearance:  Appropriate for Environment; Casual  Eye Contact: Good  Speech: Clear and Coherent; Normal Rate  Speech Volume: Normal  Handedness: Right   Mood and Affect  Mood: Euthymic; Anxious  Affect: Appropriate; Congruent   Thought Process  Thought Processes: Coherent; Goal Directed; Linear  Descriptions of Associations:Intact  Orientation:Full (Time, Place and Person)  Thought Content:Logical; WDL  Diagnosis of Schizophrenia or Schizoaffective disorder in past: No    Hallucinations:Hallucinations: None  Ideas of Reference:None  Suicidal Thoughts:Suicidal Thoughts: No  Homicidal Thoughts:Homicidal Thoughts: No   Sensorium  Memory: Immediate Good; Recent Good  Judgment: Good  Insight: Good   Executive Functions  Concentration: Good  Attention Span: Good  Recall: Good  Fund of Knowledge: Good  Language: Good   Psychomotor Activity  Psychomotor Activity: Psychomotor Activity: Normal   Assets  Assets: Communication Skills; Desire for Improvement; Resilience; Social Support; Physical Health   Sleep  Sleep: Sleep: Good  Estimated Sleeping Duration (Last 24 Hours): 6.75-8.75 hours  No data recorded  Physical Exam  Physical Exam Vitals and nursing note reviewed.  Constitutional:      Appearance: Normal appearance.  HENT:     Head: Normocephalic.     Nose: Nose normal.  Cardiovascular:     Rate and Rhythm: Normal rate.  Pulmonary:     Effort: Pulmonary effort is normal.  Musculoskeletal:        General: Normal range of motion.     Cervical back: Normal range of motion.  Neurological:      Mental Status: He is alert and oriented to person, place, and time.  Psychiatric:  Attention and Perception: Attention normal. He does not perceive auditory or visual hallucinations.        Mood and Affect: Affect normal. Mood is anxious.        Speech: Speech normal.        Behavior: Behavior normal. Behavior is cooperative.        Thought Content: Thought content normal. Thought content is not paranoid or delusional. Thought content does not include homicidal or suicidal ideation. Thought content does not include homicidal or suicidal plan.        Cognition and Memory: Cognition normal.        Judgment: Judgment normal.    Review of Systems  Constitutional: Negative.   HENT: Negative.    Eyes: Negative.   Respiratory: Negative.    Cardiovascular: Negative.   Gastrointestinal: Negative.   Genitourinary: Negative.   Musculoskeletal: Negative.   Skin: Negative.   Neurological: Negative.   Endo/Heme/Allergies: Negative.   Psychiatric/Behavioral:  Positive for substance abuse. Negative for depression, hallucinations and suicidal ideas. The patient is nervous/anxious.    Blood pressure 122/82, pulse 77, temperature 98.4 F (36.9 C), temperature source Oral, resp. rate 18, SpO2 100%. There is no height or weight on file to calculate BMI.  Demographic Factors:  Male  Loss Factors: NA  Historical Factors: NA  Risk Reduction Factors:   Responsible for children under 19 years of age, Sense of responsibility to family, Employed, Living with another person, especially a relative, Positive social support, and Positive coping skills or problem solving skills  Continued Clinical Symptoms:  Alcohol/Substance Abuse/Dependencies Previous Psychiatric Diagnoses and Treatments Medical Diagnoses and Treatments/Surgeries  Cognitive Features That Contribute To Risk:  None    Suicide Risk:  Minimal: No identifiable suicidal ideation.  Patients presenting with no risk factors but with  morbid ruminations; may be classified as minimal risk based on the severity of the depressive symptoms  Plan Of Care/Follow-up recommendations:  It is recommended to the patient to continue psychiatric medications as prescribed, after discharge from the hospital.  It is recommended to the patient to follow up with your outpatient psychiatric provider and PCP.   It was discussed with the patient, the impact of alcohol, drugs, tobacco have been there overall psychiatric and medical wellbeing, and total abstinence from substance use was recommended the patient.  7 day samples for medications provided. Prescription scripts provided at discharge.  Patient agreeable to plan. Given opportunity to ask questions. Appears to feel comfortable with discharge.    In the event of worsening symptoms, the patient is instructed to call the crisis hotline, return to [facility], call 911 and or go to the nearest ED for appropriate evaluation and treatment of symptoms.   To follow-up with primary care provider for other medical issues, concerns and or health care needs like EKG findings, HTN and current management with amlodipine .  Disposition:  Patient to discharge to [facility] in [place], [place] on 12/24/2023 for residential treatment services. Patient will be provided a taxicab voucher by nursing staff for direct transportation.  Kayen Grabel, NP 12/23/2023, 3:31 PM

## 2023-12-23 NOTE — ED Notes (Signed)
 Pt calm , cooperative. Prn hydrocortisone  cream provided. No concerns or complaints reported to RN. Pt ate dinner.

## 2023-12-23 NOTE — Discharge Instructions (Addendum)
 Per [facility] Patient has been accepted to their program.   Patient can discharge at 8:15 am on Wednesday, December 17th, 2025.   He has an intake appointment for Wednesday 12/24/2023 @ 9:00 am   [facility] [address] [place], [place] [address] Phone: [phone]    Peer Support Specialist will transport   7 day samples and scripts  Discharge recommendations:   Medications: Patient is to take medications as prescribed. The patient or patient's guardian is to contact a medical professional and/or outpatient provider to address any new side effects that develop. The patient or the patient's guardian should update outpatient providers of any new medications and/or medication changes.   Outpatient Follow up: Please review list of outpatient resources for psychiatry and counseling. Please follow up with your primary care provider for all medical related needs.   Therapy: We recommend that patient participate in individual therapy to address mental health concerns.  Atypical antipsychotics: If you are prescribed an atypical antipsychotic, it is recommended that your height, weight, BMI, blood pressure, fasting lipid panel, and fasting blood sugar be monitored by your outpatient providers.  Safety:   The following safety precautions should be taken:   No sharp objects. This includes scissors, razors, scrapers, and putty knives.   Chemicals should be removed and locked up.   Medications should be removed and locked up.   Weapons should be removed and locked up. This includes firearms, knives and instruments that can be used to cause injury.   The patient should abstain from use of illicit substances/drugs and abuse of any medications.  If symptoms worsen or do not continue to improve or if the patient becomes actively suicidal or homicidal then it is recommended that the patient return to the closest hospital  emergency department, the [facility], or call 911 for further evaluation and treatment. National Suicide Prevention Lifeline 1-800-SUICIDE or 364 011 1283.  About 988 988 offers 24/7 access to trained crisis counselors who can help people experiencing mental health-related distress. People can call or text 988 or chat 988lifeline.org for themselves or if they are worried about a loved one who may need crisis support.   Open 24 hours a day, 7 days a week. [facility] [address] [place], [place] [phone]   SUBSTANCE USE TREATMENT for Medicaid and State Funded/IPRS  [facility] [address], [place], [address] [phone] phone NOTE: [facility] is no longer offering IOP services.  Serves those who are low-income or have no insurance.  [facility] [address] [place], [place], [address] [phone] phone [phone] fax NOTE: Does have Substance Abuse-Intensive Outpatient Program [facility]) as well as transitional housing if eligible.  [facility] [address] [place], [place], [address] [phone] phone [phone] fax  [facility] [phone] [place], [place], [address] [phone] phone [phone] fax  HALFWAY HOUSES:  [facility] [phone]  [employer].oxfordvacancies.com  12 STEP PROGRAMS:  Alcoholics Anonymous of [place] softwarechalet.be  Narcotics Anonymous of [place] hitprotect.dk  Al-Anon of [place], [place] www.[place]alanon.org/find-meetings.html  Nar-Anon https://nar-anon.org/find-a-meetin  List of Residential placements:   [facility] in [place]: [phone]  [facility]: [phone]  [place], [place] [phone]: Male and male facility; 30-day program: (uninsured and Medicaid such as [place], [place],  [place], partners)  [facility]: [phone]; men and women's facility; 28 days; Can  have Medicaid tailored plan Tour Manager or Partners)  [facility]: [phone] [place] or [place]; 28 day program; must be fully detox; tailored Medicaid or no insurance  [address] in [place], [place]; [phone]; 28 day all males program; no insurance accepted  [facility] Referral in [place]: [place] [phone] (no insurance or Medicaid only); 90 days; outpatient services but provide housing in apartments [place]  [facility] Admission: [phone]: Patient must complete phone screening for placement: [place], [place]; 6 month program; uninsured, Medicaid, and Western & southern financial.   [facility]: no insurance required; [phone]  [facility]: [phone]; Intake: [place]; Must fill out application online; Steffan Delay [phone] x [address] [facility] in [place], [place]: [phone]; Admissions Coordinators Mr. Marinda or Alm Eagles; 90 day program.  [facility]: [place], [place] [phone]; Co-Ed 9 month to a year program; Online application; Men entry fee is $500 (6-54months);  [employer]: [address] 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 Free program; There is an initial $300 fee however, they are willing to work with patients regarding that. Application is online.  [facility]: Admissions [phone]  [place] Free ext [phone]; Any 7-90 day program is out of pocket; 12 month program is free of charge; there is a $275 entry fee; Patient is responsible for own transportation

## 2023-12-23 NOTE — Group Note (Signed)
 Group Topic: Relapse and Recovery  Group Date: 12/23/2023 Start Time: 1300 End Time: 1330 Facilitators: Clodfelter-Simmons, Brayan Votaw L, NT  Department: [facility]  Number of Participants: 7  Group Focus: relapse prevention Treatment Modality:  Psychoeducation Interventions utilized were problem solving Purpose: relapse prevention strategies  Name: Cristian Crawford Date of Birth: 1985/03/15  MR: [id number]    Level of Participation: active Quality of Participation: attentive Interactions with others: gave feedback Mood/Affect: appropriate Triggers (if applicable): situations where drugs are available Cognition: coherent/clear Progress: Significant Response: n/a Plan: patient will be encouraged to go to treatment.  Patients Problems:  Patient Active Problem List   Diagnosis Date Noted   Alcohol use disorder, moderate, dependence (HCC) 12/19/2023   Alcohol use disorder 12/19/2023

## 2023-12-23 NOTE — Group Note (Signed)
 Group Topic: Relapse and Recovery  Group Date: 12/23/2023 Start Time: 1300 End Time: 1330 Facilitators: Clodfelter-Simmons, Christin Mccreedy L, NT  Department: [facility]  Number of Participants: 7  Group Focus: impulsivity Treatment Modality:  Psychoeducation Interventions utilized were problem solving Purpose: relapse prevention strategies  Name: Cristian Crawford Date of Birth: July 25, 1985  MR: [id number]    Level of Participation: active Quality of Participation: attentive, cooperative, and engaged Interactions with others: gave feedback Mood/Affect: appropriate and positive Triggers (if applicable): health problems, hurt family, becoming homeless Cognition: coherent/clear, goal directed, and logical Progress: Gaining insight Response: patient wants to stop drinking and get back to work Plan: patient will be encouraged to follow his goals  Patients Problems:  Patient Active Problem List   Diagnosis Date Noted   Alcohol use disorder, moderate, dependence (HCC) 12/19/2023   Alcohol use disorder 12/19/2023

## 2023-12-24 NOTE — ED Notes (Signed)
 Pt sleeping long intervals during night. When interviewed pt denied thoughts for SI or self harm. Safety maintained.

## 2023-12-24 NOTE — ED Notes (Signed)
 Patient alert & oriented x4. Denies intent to harm self or others when asked. Denies A/VH. Patient reports pain in head, refuses need for pain medication at this time. No acute distress noted. Support and encouragement provided. Patient observed in milieu. No inappropriate behaviors observed or reported. Routine safety checks conducted per facility protocol. Encouraged patient to notify staff if any thoughts of harm towards self or others arise. Patient verbalizes understanding and agreement.

## 2023-12-24 NOTE — ED Notes (Signed)
 Patient discharged to [facility] with Peer Support per provider order. After Visit Summary (AVS) printed and given to patient, as well as printed prescriptions. AVS reviewed with patient and all questions fully answered. Patient discharged in no acute distress, A& O x4 and ambulatory. Patient denied SI/HI, A/VH upon discharge. Patient verbalized understanding of all discharge instructions explained by staff, including follow up appointments, RX's and safety plan. Patient mood fair. Patient belongings returned to patient from locker #21 complete and intact. Patient escorted to lobby via staff for transport to destination. Safety maintained.

## 2023-12-24 NOTE — ED Notes (Signed)
 Pt sleeping long periods throughout night no acute distress.  Safety maintained.

## 2024-01-12 ENCOUNTER — Ambulatory Visit: Admitting: Physician Assistant

## 2024-01-12 ENCOUNTER — Encounter: Payer: Self-pay | Admitting: Physician Assistant

## 2024-01-12 VITALS — BP 140/93 | HR 66 | Ht 69.0 in | Wt 171.0 lb

## 2024-01-12 DIAGNOSIS — R7989 Other specified abnormal findings of blood chemistry: Secondary | ICD-10-CM

## 2024-01-12 DIAGNOSIS — L2082 Flexural eczema: Secondary | ICD-10-CM

## 2024-01-12 DIAGNOSIS — M25511 Pain in right shoulder: Secondary | ICD-10-CM

## 2024-01-12 DIAGNOSIS — J4520 Mild intermittent asthma, uncomplicated: Secondary | ICD-10-CM

## 2024-01-12 DIAGNOSIS — G8929 Other chronic pain: Secondary | ICD-10-CM | POA: Diagnosis not present

## 2024-01-12 DIAGNOSIS — M25512 Pain in left shoulder: Secondary | ICD-10-CM | POA: Diagnosis not present

## 2024-01-12 DIAGNOSIS — J302 Other seasonal allergic rhinitis: Secondary | ICD-10-CM

## 2024-01-12 DIAGNOSIS — F1010 Alcohol abuse, uncomplicated: Secondary | ICD-10-CM

## 2024-01-12 MED ORDER — TRIAMCINOLONE ACETONIDE 0.1 % EX CREA: 1.0000 | TOPICAL_CREAM | Freq: Two times a day (BID) | CUTANEOUS | 0 refills | Status: AC

## 2024-01-12 MED ORDER — METHYLPREDNISOLONE ACETATE 80 MG/ML IJ SUSP
80.0000 mg | Freq: Once | INTRAMUSCULAR | Status: AC
  Administered 2024-01-12: 80 mg via INTRAMUSCULAR

## 2024-01-12 MED ORDER — TRIAMCINOLONE ACETONIDE 0.1 % EX CREA: 1.0000 | TOPICAL_CREAM | Freq: Two times a day (BID) | CUTANEOUS | 0 refills | Status: DC

## 2024-01-12 MED ORDER — ALBUTEROL SULFATE HFA 108 (90 BASE) MCG/ACT IN AERS: 1.0000 | INHALATION_SPRAY | RESPIRATORY_TRACT | 0 refills | Status: AC | PRN

## 2024-01-12 MED ORDER — CETIRIZINE HCL 10 MG PO TABS: 10.0000 mg | ORAL_TABLET | Freq: Every day | ORAL | 11 refills | Status: AC

## 2024-01-12 NOTE — Progress Notes (Signed)
 "  New Patient Office Visit  Subjective    Patient ID: Cristian Crawford, male    DOB: [birth year]  Age: 39 y.o. MRN: [id number]  CC:  Chief Complaint  Patient presents with   Rash    States he has eczema behind both legs   Shoulder Pain    Stats he has chronic pain in both of his shoulders and it makes it hard for him to sleep at night    Discussed the use of AI scribe software for clinical note transcription with the patient, who gave verbal consent to proceed.  History of Present Illness   Cristian Crawford is a 39 year old male who presents with a rash and chronic shoulder pain.  He is being treated for substance abuse at [facility] recovery center  He has a new pruritic rash on the back of his leg, inside of his arm, and neck. It is improving slightly with over-the-counter cortisone cream. He suspects a new soap as the trigger and has had similar rashes before.  He does state that he has a history of eczema, and it tends to flareup with any new soaps.  He has also been exposed to new detergents  He has asthma diagnosed in childhood and is using his rescue inhaler about three times a day recently. He thinks mold exposure may be contributing.  He is not on a daily inhaler or allergy medication.  He has chronic bilateral shoulder pain that has been evaluated with prior MRIs and treated with cortisone injections. A previous physician told him it may be related to joint hypermobility. 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 Aleve improves pain during the day but not at night.  He reports chronic bone and joint pain in his shoulders and knees. He denies recent shoulder injuries and can voluntarily pop his shoulders in and out of place.    Outpatient Encounter Medications as of 01/12/2024  Medication Sig   cetirizine  (ZYRTEC  ALLERGY) 10 MG tablet Take 1 tablet (10 mg total) by mouth daily.   [DISCONTINUED] triamcinolone  cream (KENALOG ) 0.1 %  Apply 1 Application topically 2 (two) times daily.   albuterol  (VENTOLIN  HFA) 108 (90 Base) MCG/ACT inhaler Inhale 1-2 puffs into the lungs every 4 (four) hours as needed for wheezing or shortness of breath.   amLODipine  (NORVASC ) 5 MG tablet Take 1 tablet (5 mg total) by mouth daily.   hydrocortisone  cream 1 % Apply topically 3 (three) times daily as needed for itching.   Multiple Vitamin (MULTIVITAMIN WITH MINERALS) TABS tablet Take 1 tablet by mouth daily.   naltrexone  (DEPADE) 50 MG tablet Take 1 tablet (50 mg total) by mouth daily.   thiamine  (VITAMIN B-1) 100 MG tablet Take 1 tablet (100 mg total) by mouth daily.   traZODone  (DESYREL ) 50 MG tablet Take 1 tablet (50 mg total) by mouth at bedtime as needed for sleep.   triamcinolone  cream (KENALOG ) 0.1 % Apply 1 Application topically 2 (two) times daily.   [DISCONTINUED] albuterol  (VENTOLIN  HFA) 108 (90 Base) MCG/ACT inhaler Inhale 1-2 puffs into the lungs every 4 (four) hours as needed for wheezing or shortness of breath.   [EXPIRED] methylPREDNISolone  acetate (DEPO-MEDROL ) injection 80 mg    No facility-administered encounter medications on file as of 01/12/2024.    Past Medical History:  Diagnosis Date   Asthma     Past Surgical History:  Procedure Laterality Date   HAND SURGERY Right  History reviewed. No pertinent family history.  Social History   Socioeconomic History   Marital status: Single    Spouse name: Not on file   Number of children: Not on file   Years of education: Not on file   Highest education level: Not on file  Occupational History   Not on file  Tobacco Use   Smoking status: Never   Smokeless tobacco: Not on file  Substance and Sexual Activity   Alcohol use: Yes   Drug use: Yes    Types: Marijuana   Sexual activity: Not on file  Other Topics Concern   Not on file  Social History Narrative   Not on file   Social Drivers of Health   Tobacco Use: Unknown (01/12/2024)   Patient History     Smoking Tobacco Use: Never    Smokeless Tobacco Use: Unknown    Passive Exposure: Not on file  Financial Resource Strain: Not on file  Food Insecurity: No Food Insecurity (12/19/2023)   Epic    Worried About Programme Researcher, Broadcasting/film/video in the Last Year: Never true    Ran Out of Food in the Last Year: Never true  Transportation Needs: No Transportation Needs (12/19/2023)   Epic    Lack of Transportation (Medical): No    Lack of Transportation (Non-Medical): No  Physical Activity: Not on file  Stress: Not on file  Social Connections: Not on file  Intimate Partner Violence: Not At Risk (12/19/2023)   Epic    Fear of Current or Ex-Partner: No    Emotionally Abused: No    Physically Abused: No    Sexually Abused: No  Depression (PHQ2-9): Low Risk (12/24/2023)   Depression (PHQ2-9)    PHQ-2 Score: 0  Recent Concern: Depression (PHQ2-9) - Medium Risk (12/19/2023)   Depression (PHQ2-9)    PHQ-2 Score: 5  Alcohol Screen: Not on file  Housing: Not on file  Utilities: Not At Risk (12/19/2023)   Epic    Threatened with loss of utilities: No  Health Literacy: Not on file    Review of Systems  Constitutional: Negative.   HENT: Negative.    Eyes: Negative.   Respiratory:  Negative for shortness of breath.   Cardiovascular:  Negative for chest pain.  Gastrointestinal: Negative.   Genitourinary: Negative.   Musculoskeletal:  Positive for joint pain and myalgias.  Skin:  Positive for itching and rash.  Neurological: Negative.   Endo/Heme/Allergies: Negative.   Psychiatric/Behavioral: Negative.          Objective    BP (!) 140/93 (BP Location: Left Arm, Patient Position: Sitting)   Pulse 66   Ht 5' 9 (1.753 m)   Wt 171 lb (77.6 kg)   SpO2 97%   BMI 25.25 kg/m   Physical Exam Vitals and nursing note reviewed.  Constitutional:      Appearance: Normal appearance.  HENT:     Head: Normocephalic and atraumatic.     Right Ear: External ear normal.     Left Ear: External ear  normal.     Nose: Nose normal.     Mouth/Throat:     Mouth: Mucous membranes are moist.     Pharynx: Oropharynx is clear.  Eyes:     Extraocular Movements: Extraocular movements intact.     Conjunctiva/sclera: Conjunctivae normal.     Pupils: Pupils are equal, round, and reactive to light.  Cardiovascular:     Rate and Rhythm: Normal rate and regular rhythm.  Pulses: Normal pulses.     Heart sounds: Normal heart sounds.  Pulmonary:     Effort: Pulmonary effort is normal.     Breath sounds: Normal breath sounds.  Musculoskeletal:     Right shoulder: No swelling. Normal range of motion. Normal strength.     Left shoulder: No swelling. Normal range of motion. Normal strength.     Cervical back: Normal range of motion and neck supple.  Skin:    General: Skin is warm and dry.     Findings: Rash present.     Comments: See photos  Neurological:     General: No focal deficit present.     Mental Status: He is alert and oriented to person, place, and time.  Psychiatric:        Mood and Affect: Mood normal.        Behavior: Behavior normal.        Thought Content: Thought content normal.        Judgment: Judgment normal.               Assessment & Plan:   Problem List Items Addressed This Visit       Other   Alcohol use disorder, moderate, dependence (HCC)   Other Visit Diagnoses       Flexural eczema    -  Primary   Relevant Medications   methylPREDNISolone  acetate (DEPO-MEDROL ) injection 80 mg (Completed)   triamcinolone  cream (KENALOG ) 0.1 %     Chronic pain of both shoulders       Relevant Medications   methylPREDNISolone  acetate (DEPO-MEDROL ) injection 80 mg (Completed)   Other Relevant Orders   Ambulatory referral to Orthopedic Surgery   Vitamin D , 25-hydroxy (Completed)     Mild intermittent asthma without complication       Relevant Medications   albuterol  (VENTOLIN  HFA) 108 (90 Base) MCG/ACT inhaler   cetirizine  (ZYRTEC  ALLERGY) 10 MG tablet    methylPREDNISolone  acetate (DEPO-MEDROL ) injection 80 mg (Completed)     Seasonal allergies       Relevant Medications   cetirizine  (ZYRTEC  ALLERGY) 10 MG tablet     Abnormal thyroid  blood test       Relevant Orders   Thyroid  Panel With TSH (Completed)      1. Flexural eczema (Primary) Trial Kenalog , patient given steroid injection due to extensive rash.  Patient education given on supportive care.  Red flags given for prompt reevaluation - methylPREDNISolone  acetate (DEPO-MEDROL ) injection 80 mg - triamcinolone  cream (KENALOG ) 0.1 %; Apply 1 Application topically 2 (two) times daily.  Dispense: 80 g; Refill: 0  2. Chronic pain of both shoulders Patient education given on supportive care, referred to orthopedics for further evaluation - Ambulatory referral to Orthopedic Surgery - Vitamin D , 25-hydroxy  3. Mild intermittent asthma without complication Trial Zyrtec , refill albuterol  - albuterol  (VENTOLIN  HFA) 108 (90 Base) MCG/ACT inhaler; Inhale 1-2 puffs into the lungs every 4 (four) hours as needed for wheezing or shortness of breath.  Dispense: 1 each; Refill: 0 - cetirizine  (ZYRTEC  ALLERGY) 10 MG tablet; Take 1 tablet (10 mg total) by mouth daily.  Dispense: 30 tablet; Refill: 11  4. Seasonal allergies  - cetirizine  (ZYRTEC  ALLERGY) 10 MG tablet; Take 1 tablet (10 mg total) by mouth daily.  Dispense: 30 tablet; Refill: 11  5. Abnormal thyroid  blood test  - Thyroid  Panel With TSH  6. Alcohol abuse Currently in substance abuse treatment program - Vitamin D , 25-hydroxy   I have reviewed the  patient's medical history (PMH, PSH, Social History, Family History, Medications, and allergies) , and have been updated if relevant. I spent 30 minutes reviewing chart and  face to face time with patient.    Return in about 2 weeks (around 01/26/2024) for With [facility].   Kirk RAMAN Mayers, PA-C   "

## 2024-01-12 NOTE — Patient Instructions (Signed)
 VISIT SUMMARY:  Today, you were seen for a new rash, increased asthma symptoms, and chronic shoulder pain. We also discussed your previous thyroid  test and potential vitamin D  deficiency.  YOUR PLAN:  -FLEXURAL ECZEMA: Flexural eczema is a skin condition that causes itchy and inflamed patches of skin, often triggered by irritants like soap. You have a new rash on the back of your leg, inside of your arm, and neck, which is improving with cortisone cream. We prescribed Kenalog  cream and gave you a steroid injection to help with the rash.  -ASTHMA: Asthma is a condition where your airways narrow and swell, making it hard to breathe. You've been using your rescue inhaler more frequently, possibly due to mold exposure. We refilled your albuterol  inhaler and recommended you start taking a daily allergy medication Zyrtec .  -CHRONIC BILATERAL SHOULDER PAIN: Chronic bilateral shoulder pain means you have ongoing pain in both shoulders, possibly due to joint hypermobility. This pain worsens with heavy lifting and affects your sleep. We are referring you to orthopedics for further evaluation   -THYROID  DYSFUNCTION UNDER EVALUATION: Thyroid  dysfunction can affect your metabolism and energy levels. You had an abnormal thyroid  test in the past We ordered a thyroid  panel with TSH to further investigate.

## 2024-01-13 ENCOUNTER — Encounter: Payer: Self-pay | Admitting: Physician Assistant

## 2024-01-13 ENCOUNTER — Ambulatory Visit: Payer: Self-pay | Admitting: Physician Assistant

## 2024-01-13 DIAGNOSIS — E559 Vitamin D deficiency, unspecified: Secondary | ICD-10-CM

## 2024-01-13 LAB — THYROID PANEL WITH TSH
Free Thyroxine Index: 1.8 (ref 1.2–4.9)
T3 Uptake Ratio: 27 % (ref 24–39)
T4, Total: 6.8 ug/dL (ref 4.5–12.0)
TSH: 1 u[IU]/mL (ref 0.450–4.500)

## 2024-01-13 LAB — VITAMIN D 25 HYDROXY (VIT D DEFICIENCY, FRACTURES): Vit D, 25-Hydroxy: 20.6 ng/mL — ABNORMAL LOW (ref 30.0–100.0)

## 2024-01-13 MED ORDER — VITAMIN D 50 MCG (2000 UT) PO CAPS: 2000.0000 [IU] | ORAL_CAPSULE | Freq: Every day | ORAL | 1 refills | Status: AC

## 2024-01-13 NOTE — Progress Notes (Signed)
 Detailed message left with Stacie Hill aftercare coordinator at [facility] with Test results and provider recommendations.

## 2024-01-23 ENCOUNTER — Ambulatory Visit: Admitting: Physician Assistant
